# Patient Record
Sex: Female | Born: 1965 | Race: White | Hispanic: No | Marital: Married | Smoking: Current every day smoker
Health system: Southern US, Community
[De-identification: ages and names within clinical notes are randomized; demographics above are authoritative.]

## PROBLEM LIST (undated history)

## (undated) DIAGNOSIS — N200 Calculus of kidney: Secondary | ICD-10-CM

## (undated) DIAGNOSIS — J449 Chronic obstructive pulmonary disease, unspecified: Secondary | ICD-10-CM

## (undated) DIAGNOSIS — E119 Type 2 diabetes mellitus without complications: Secondary | ICD-10-CM

## (undated) HISTORY — PX: COMBINED AUGMENTATION MAMMAPLASTY AND ABDOMINOPLASTY: SUR291

## (undated) HISTORY — PX: BILATERAL CARPAL TUNNEL RELEASE: SHX6508

## (undated) HISTORY — PX: BRAIN SURGERY: SHX531

## (undated) HISTORY — PX: BREAST SURGERY: SHX581

## (undated) HISTORY — PX: ABLATION: SHX5711

## (undated) HISTORY — DX: Calculus of kidney: N20.0

---

## 2021-05-16 ENCOUNTER — Other Ambulatory Visit: Payer: Self-pay

## 2021-05-16 ENCOUNTER — Encounter (HOSPITAL_COMMUNITY): Payer: Self-pay

## 2021-05-16 ENCOUNTER — Emergency Department (HOSPITAL_COMMUNITY)
Admission: EM | Admit: 2021-05-16 | Discharge: 2021-05-16 | Disposition: A | Discharge status: Home / Self Care | Payer: Medicaid Other | Attending: Emergency Medicine | Admitting: Emergency Medicine

## 2021-05-16 ENCOUNTER — Emergency Department (HOSPITAL_COMMUNITY): Payer: Medicaid Other

## 2021-05-16 DIAGNOSIS — Z20822 Contact with and (suspected) exposure to covid-19: Secondary | ICD-10-CM | POA: Diagnosis not present

## 2021-05-16 DIAGNOSIS — J029 Acute pharyngitis, unspecified: Secondary | ICD-10-CM | POA: Diagnosis present

## 2021-05-16 DIAGNOSIS — J069 Acute upper respiratory infection, unspecified: Secondary | ICD-10-CM | POA: Insufficient documentation

## 2021-05-16 DIAGNOSIS — H9209 Otalgia, unspecified ear: Secondary | ICD-10-CM | POA: Insufficient documentation

## 2021-05-16 DIAGNOSIS — J441 Chronic obstructive pulmonary disease with (acute) exacerbation: Secondary | ICD-10-CM | POA: Diagnosis not present

## 2021-05-16 HISTORY — DX: Type 2 diabetes mellitus without complications: E11.9

## 2021-05-16 HISTORY — DX: Chronic obstructive pulmonary disease, unspecified: J44.9

## 2021-05-16 LAB — RESP PANEL BY RT-PCR (RSV, FLU A&B, COVID)  RVPGX2
Influenza A by PCR: NEGATIVE
Influenza B by PCR: NEGATIVE
Resp Syncytial Virus by PCR: NEGATIVE
SARS Coronavirus 2 by RT PCR: NEGATIVE

## 2021-05-16 LAB — GROUP A STREP BY PCR: Group A Strep by PCR: NOT DETECTED

## 2021-05-16 MED ORDER — PREDNISONE 20 MG PO TABS
40.0000 mg | ORAL_TABLET | Freq: Once | ORAL | Status: AC
Start: 2021-05-16 — End: 2021-05-16
  Administered 2021-05-16: 40 mg via ORAL
  Filled 2021-05-16: qty 2

## 2021-05-16 MED ORDER — BENZONATATE 100 MG PO CAPS: 100.0000 mg | ORAL_CAPSULE | Freq: Three times a day (TID) | ORAL | 0 refills | Status: DC

## 2021-05-16 MED ORDER — DOXYCYCLINE HYCLATE 100 MG PO TABS
100.0000 mg | ORAL_TABLET | Freq: Once | ORAL | Status: AC
  Administered 2021-05-16: 100 mg via ORAL
  Filled 2021-05-16: qty 1

## 2021-05-16 MED ORDER — PREDNISONE 20 MG PO TABS: 40.0000 mg | ORAL_TABLET | Freq: Every day | ORAL | 0 refills | Status: DC

## 2021-05-16 MED ORDER — DOXYCYCLINE HYCLATE 100 MG PO CAPS: 100.0000 mg | ORAL_CAPSULE | Freq: Two times a day (BID) | ORAL | 0 refills | Status: AC

## 2021-05-16 NOTE — ED Triage Notes (Signed)
Patient presents to ED with sore throat and right ear pain. Pain started last Friday and symptoms continue to get worse. Patient stated that family members present with similar symptoms. ?

## 2021-05-16 NOTE — ED Provider Notes (Signed)
?[facility] ?Provider Note ? ? ?CSN: [id number] ?Arrival date & time: 05/16/21  1624 ? ?  ? ?History ? ?Chief Complaint  ?Patient presents with  ? Otalgia  ? Sore Throat  ? ? ?Mikayla Williams is a 56 y.o. female. ? ? ?Otalgia ?Associated symptoms: cough and sore throat   ?Associated symptoms: no fever   ?Sore Throat ?Associated symptoms include shortness of breath.  ? 56 y/o female - with COPD - and long time smoker - dx with COPD -on an inhaler regularly and uses oxygen at night at baseline.  Her daughter came home sick from school with coughing sore throat, this is gradually improved and she is states that she is gradually getting better.  However the patient and her spouse have become symptomatic over the last several days and are both coughing with sore throat.  The patient has not had any vomiting or diarrhea.  She does have some right-sided ear pain as well as a sore throat and a cough. ? ?Home Medications ?Prior to Admission medications   ?Medication Sig Start Date End Date Taking? Authorizing Provider  ?benzonatate (TESSALON) 100 MG capsule Take 1 capsule (100 mg total) by mouth every 8 (eight) hours. 05/16/21  Yes Noemi Chapel, MD  ?doxycycline (VIBRAMYCIN) 100 MG capsule Take 1 capsule (100 mg total) by mouth 2 (two) times daily for 7 days. 05/16/21 05/23/21 Yes Noemi Chapel, MD  ?predniSONE (DELTASONE) 20 MG tablet Take 2 tablets (40 mg total) by mouth daily. 05/16/21  Yes Noemi Chapel, MD  ?   ? ?Allergies    ?Codeine   ? ?Review of Systems   ?Review of Systems  ?Constitutional:  Negative for fever.  ?HENT:  Positive for ear pain and sore throat.   ?Respiratory:  Positive for cough and shortness of breath.   ?All other systems reviewed and are negative. ? ?Physical Exam ?Updated Vital Signs ?BP 121/84   Pulse 90   Temp 97.9 ?F (36.6 ?C) (Oral)   Resp 20   Ht 1.6 m ('5\' 3"'$ )   Wt 89.8 kg   SpO2 92%   BMI 35.07 kg/m?  ?Physical Exam ?Vitals and nursing note reviewed.  ?Constitutional:    ?   General: She is not in acute distress. ?   Appearance: She is well-developed.  ?HENT:  ?   Head: Normocephalic and atraumatic.  ?   Right Ear: Tympanic membrane, ear canal and external ear normal.  ?   Left Ear: Tympanic membrane, ear canal and external ear normal.  ?   Nose: Nose normal. No congestion or rhinorrhea.  ?   Mouth/Throat:  ?   Mouth: Mucous membranes are moist.  ?   Pharynx: No oropharyngeal exudate or posterior oropharyngeal erythema.  ?Eyes:  ?   General: No scleral icterus.    ?   Right eye: No discharge.     ?   Left eye: No discharge.  ?   Conjunctiva/sclera: Conjunctivae normal.  ?   Pupils: Pupils are equal, round, and reactive to light.  ?Neck:  ?   Thyroid: No thyromegaly.  ?   Vascular: No JVD.  ?Cardiovascular:  ?   Rate and Rhythm: Normal rate and regular rhythm.  ?   Heart sounds: Normal heart sounds. No murmur heard. ?  No friction rub. No gallop.  ?Pulmonary:  ?   Effort: Pulmonary effort is normal. No respiratory distress.  ?   Breath sounds: No wheezing or rales.  ?   Comments:  Slight diminished breath sounds bilaterally but able to speak in full sentences and is not tachypneic.  Oxygen is 94% on room air ?Abdominal:  ?   General: Bowel sounds are normal. There is no distension.  ?   Palpations: Abdomen is soft. There is no mass.  ?   Tenderness: There is no abdominal tenderness.  ?Musculoskeletal:     ?   General: No tenderness. Normal range of motion.  ?   Cervical back: Normal range of motion and neck supple.  ?   Right lower leg: No edema.  ?   Left lower leg: No edema.  ?Lymphadenopathy:  ?   Cervical: No cervical adenopathy.  ?Skin: ?   General: Skin is warm and dry.  ?   Findings: No erythema or rash.  ?Neurological:  ?   Mental Status: She is alert.  ?   Coordination: Coordination normal.  ?Psychiatric:     ?   Behavior: Behavior normal.  ? ? ?ED Results / Procedures / Treatments   ?Labs ?(all labs ordered are listed, but only abnormal results are displayed) ?Labs Reviewed   ?RESP PANEL BY RT-PCR (RSV, FLU A&B, COVID)  [id number]  ?GROUP A STREP BY PCR  ? ? ?EKG ?None ? ?Radiology ?DG Chest Port 1 View ? ?Result Date: 05/16/2021 ?CLINICAL DATA:  Cough and shortness of breath.  Chest congestion. EXAM: PORTABLE CHEST 1 VIEW COMPARISON:  None. FINDINGS: The cardiomediastinal contours are normal. Subsegmental opacities at the left lung base. Pulmonary vasculature is normal. No pleural effusion or pneumothorax. No acute osseous abnormalities are seen. IMPRESSION: Subsegmental opacities at the left lung base, favor atelectasis, although the possibility of pneumonia in the setting of cough is not entirely excluded. Electronically Signed   By: Keith Rake M.D.   On: 05/16/2021 17:06   ? ?Procedures ?Procedures  ? ? ?Medications Ordered in ED ?Medications  ?doxycycline (VIBRA-TABS) tablet 100 mg (has no administration in time range)  ?predniSONE (DELTASONE) tablet 40 mg (40 mg Oral Given 05/16/21 1713)  ? ? ?ED Course/ Medical Decision Making/ A&P ?  ?                        ?Medical Decision Making ?Amount and/or Complexity of Data Reviewed ?Radiology: ordered. ? ?Risk ?Prescription drug management. ? ? ?This patient presents to the ED for concern of shortness of breath with sore throat differential diagnosis includes viral illness versus pneumonia, given that the patient has COPD she will need a chest x-ray, will also give prednisone for her treatment of some reactive airway disease likely associated with this.  There is no signs of otitis media ? ? ? ?Additional history obtained: ? ?Additional history obtained from patient's spouse, there is no prior medical records ?External records from outside source obtained and reviewed including no prior medical records ? ? ?Lab Tests: ? ?I Ordered, and personally interpreted labs.  The pertinent results include: Viral testing: Negative viral testing, negative strep testing ? ? ?Imaging Studies ordered: ? ?I ordered imaging studies including portable  chest x-ray ?I independently visualized and interpreted imaging which showed possible early pneumonia ?I agree with the radiologist interpretation ? ? ?Medicines ordered and prescription drug management: ? ?I ordered medication including doxycycline, home with Tessalon prednisone and albuterol for ongoing COPD exacerbation ?Reevaluation of the patient after these medicines showed that the patient improved ?I have reviewed the patients home medicines and have made adjustments as needed ? ? ?Problem List /  ED Course: ? ?Possible early pneumonia, treated with doxycycline, vital signs very stable without tachycardia or fever, oxygen is in the mid 90% range and very stable for discharge, already has albuterol inhaler, understands indications for return ? ? ?Social Determinants of Health: ? ?Tobacco use chronically, I did counsel the patient on her tobacco use and states that she does not want to stop ? ? ? ? ? ? ? ? ? ? ?Final Clinical Impression(s) / ED Diagnoses ?Final diagnoses:  ?Viral URI  ?COPD exacerbation ([place])  ? ? ?Rx / DC Orders ?ED Discharge Orders   ? ?      Ordered  ?  predniSONE (DELTASONE) 20 MG tablet  Daily       ? 05/16/21 1819  ?  doxycycline (VIBRAMYCIN) 100 MG capsule  2 times daily       ? 05/16/21 1819  ?  benzonatate (TESSALON) 100 MG capsule  Every 8 hours       ? 05/16/21 1819  ? ?  ?  ? ?  ? ? ?  ?Noemi Chapel, MD ?05/16/21 1821 ? ?

## 2021-05-16 NOTE — Discharge Instructions (Signed)
Your testing did not show any signs of specific diagnosis, you do not have strep COVID or the flu.  You do have some other virus which will gradually get better however the x-ray showed that you may have a very early spot of pneumonia.  For this I would like for you to take the following medication ? ?Doxycycline '100mg'$  by mouth twice daily until the medicine is completely finished - this medicine is a strong antibiotic that treats certain infections.   ? ? ?Please take Tessalon '100mg'$  by mouth 3 times daily for cough as needed. ? ? ?Prednisone is a steroid that helps to reduce certain types of inflammation and may be used for allergic reactions, some rashes such as poison ivy or dermatitis, for asthma attacks or bronchitis and for certain types of pain.  Please take this medicine exactly as prescribed - '40mg'$  by mouth daily for 5 days.  This can have certain side effects with some people including feeling like you can't sleep, feeling anxious or feeling like you are on a "high".  It should not cause weight gain if only taken for a short time.   ? ? ?Thank you for letting Mikayla Williams take care of you today! ? ?Please obtain all of your results from medical records or have your doctors office obtain the results - share them with your doctor - you should be seen at your doctors office in the next 2 days. Call today to arrange your follow up. Take the medications as prescribed. Please review all of the medicines and only take them if you do not have an allergy to them. Please be aware that if you are taking birth control pills, taking other prescriptions, ESPECIALLY ANTIBIOTICS may make the birth control ineffective - if this is the case, either do not engage in sexual activity or use alternative methods of birth control such as condoms until you have finished the medicine and your family doctor says it is OK to restart them. If you are on a blood thinner such as COUMADIN, be aware that any other medicine that you take may cause  the coumadin to either work too much, or not enough - you should have your coumadin level rechecked in next 7 days if this is the case.  ??  ?It is also a possibility that you have an allergic reaction to any of the medicines that you have been prescribed - Everybody reacts differently to medications and while MOST people have no trouble with most medicines, you may have a reaction such as nausea, vomiting, rash, swelling, shortness of breath. If this is the case, please stop taking the medicine immediately and contact your physician.  ? ?If you were given a medication in the ED such as percocet, vicodin, or morphine, be aware that these medicines are sedating and may change your ability to take care of yourself adequately for several hours after being given this medicines - you should not drive or take care of small children if you were given this medicine in the Emergency Department or if you have been prescribed these types of medicines. ??  ? You should return to the ER IMMEDIATELY if you develop severe or worsening symptoms.  ? ?

## 2021-06-04 ENCOUNTER — Ambulatory Visit: Payer: Medicaid Other | Admitting: Cardiology

## 2021-06-16 ENCOUNTER — Ambulatory Visit (INDEPENDENT_AMBULATORY_CARE_PROVIDER_SITE_OTHER): Payer: Medicaid Other | Admitting: Internal Medicine

## 2021-06-16 VITALS — BP 148/90 | HR 76 | Ht 63.0 in | Wt 201.4 lb

## 2021-06-16 DIAGNOSIS — I739 Peripheral vascular disease, unspecified: Secondary | ICD-10-CM | POA: Diagnosis not present

## 2021-06-16 DIAGNOSIS — R0609 Other forms of dyspnea: Secondary | ICD-10-CM

## 2021-06-16 NOTE — Progress Notes (Signed)
?Cardiology Office Note:   ? ?Date:  06/16/2021  ? ?ID:  Mikayla Williams, DOB [birth year], MRN [id number] ? ?PCP:  Ludwig Clarks, FNP ?  ?[facility] Providers ?Cardiologist:  Janina Mayo, MD    ? ?Referring MD: Ludwig Clarks, FNP  ? ?No chief complaint on file. ?DOE ? ?History of Present Illness:   ? ?Mikayla Williams is a 56 y.o. female with a hx of COPD, DM2 , hypothyroid dx on synthroid, referral from Mikayla Williams for [facility] ? ?She's been short of breath for 10 years. It has progressed over time. If she is more active it can get worse with activity. She 's been smoking since age 54. Tried cessation and it has not helped. She's never had PFTs. Her albuterol helps. She denies chest pain or pressure. She had parasthesias in her feet and SOB. She saw a cardiologist in [place] in [place]. She had a lexiscan but there are no results. She notes she did not get the results.  She has an appointment with the pulmonologist on the 24th.  ? ?Notes leg numbness and pain in the calfs with walking. ? ?She has no cardiac dx history or studies. ? ? ?Past Medical History:  ?Diagnosis Date  ? COPD (chronic obstructive pulmonary disease) ([place])   ? Diabetes mellitus without complication ([place])   ? ? ? ?Current Medications: ?Current Meds  ?Medication Sig  ? albuterol (VENTOLIN HFA) 108 (90 Base) MCG/ACT inhaler Inhale 2 puffs into the lungs every 6 (six) hours as needed.  ? atorvastatin (LIPITOR) 40 MG tablet Take 40 mg by mouth daily.  ? benzonatate (TESSALON) 100 MG capsule Take by mouth 3 (three) times daily as needed for cough.  ? cyclobenzaprine (FLEXERIL) 10 MG tablet Take 10 mg by mouth 3 (three) times daily as needed.  ? famotidine (PEPCID) 40 MG tablet Take 40 mg by mouth daily.  ? fenofibrate (TRICOR) 145 MG tablet Take 145 mg by mouth daily.  ? FLUoxetine (PROZAC) 40 MG capsule Take 40 mg by mouth daily.  ? furosemide (LASIX) 20 MG tablet Take 1 tablet by mouth daily.  ? gabapentin (NEURONTIN) 400 MG capsule Take 400 mg by mouth  3 (three) times daily.  ? glyBURIDE (DIABETA) 5 MG tablet Take by mouth daily.  ? GNP ASPIRIN LOW DOSE 81 MG EC tablet Take 81 mg by mouth daily.  ? hydrOXYzine (ATARAX) 25 MG tablet Take 25 mg by mouth 4 (four) times daily.  ? levothyroxine (SYNTHROID) 137 MCG tablet Take 137 mcg by mouth daily.  ? meloxicam (MOBIC) 15 MG tablet Take 15 mg by mouth daily.  ? Vitamin D, Ergocalciferol, (DRISDOL) 1.25 MG (50000 UNIT) CAPS capsule Take by mouth.  ?  ? ?Allergies:   Codeine  ? ?Social History  ? ?Socioeconomic History  ? Marital status: Married  ?  Spouse name: Not on file  ? Number of children: Not on file  ? Years of education: Not on file  ? Highest education level: Not on file  ?Occupational History  ? Not on file  ?Tobacco Use  ? Smoking status: Not on file  ? Smokeless tobacco: Not on file  ?Substance and Sexual Activity  ? Alcohol use: Not on file  ? Drug use: Not on file  ? Sexual activity: Not on file  ?Other Topics Concern  ? Not on file  ?Social History Narrative  ? Not on file  ? ?Social Determinants of Health  ? ?Financial Resource Strain: Not on file  ?  Food Insecurity: Not on file  ?Transportation Needs: Not on file  ?Physical Activity: Not on file  ?Stress: Not on file  ?Social Connections: Not on file  ?  ? ?Family History: ?The patient's Both parents with CHF.  Mother was in her 25s. Deceased in her 91s. ? ?ROS:   ?Please see the history of present illness.    ? All other systems reviewed and are negative. ? ?EKGs/Labs/Other Studies Reviewed:   ? ?The following studies were reviewed today: ? ? ?EKG:  EKG is  ordered today.  The ekg ordered today demonstrates  ? ?06/16/2021 EKG-NSR ? ?Recent Labs: ?No results found for requested labs within last 8760 hours.  ?Recent Lipid Panel ?No results found for: CHOL, TRIG, HDL, CHOLHDL, VLDL, LDLCALC, LDLDIRECT ? ? ?Risk Assessment/Calculations:   ?  ?    ? ?Physical Exam:   ? ?VS:   ? ?Vitals:  ? 06/16/21 1033  ?BP: (!) 148/90  ?Pulse: 76  ?SpO2: 98%  ? ? ? ?Wt  Readings from Last 3 Encounters:  ?06/16/21 201 lb 6.4 oz (91.4 kg)  ?05/16/21 198 lb (89.8 kg)  ?  ? ?GEN:  Well nourished, well developed in no acute distress ?HEENT: Normal ?NECK: No JVD; No carotid bruits ?LYMPHATICS: No lymphadenopathy ?CARDIAC: RRR, no murmurs, rubs, gallops ?RESPIRATORY:  Clear to auscultation without rales, wheezing or rhonchi  ?ABDOMEN: Soft, non-tender, non-distended ?MUSCULOSKELETAL:  No edema; No deformity  ?SKIN: Warm and dry ?Vasc: 2+ DP pulses, skin warm and well perfused. ?NEUROLOGIC:  Alert and oriented x 3 ?PSYCHIATRIC:  Normal affect  ? ?ASSESSMENT:   ? ?DOE: likely related to COPD, recommend PFTs. Will get stress test results.  This is chronic, unlikely related to acute coronary lesion. Discussed the importance of smoking cessation. ? ?Claudication: noted calf pain and paraesthesias. Will get ABIs ? ?HLD- can get yearly lipid profile with Ldl goal < 100 mg/dL. Continue atorvastatin 40 mg daily ? ?HTN- recommended home blood pressure cuff and serial measurements. Provided that today. If not at goal < 130/80 mmhg, can start antihypertensive.  ? ?PLAN:   ? ?In order of problems listed above: ? ?ABI  BL ?Getting records to access her stress test, will follow up ?Follow up 3 months ?   ?Medication Adjustments/Labs and Tests Ordered: ?Current medicines are reviewed at length with the patient today.  Concerns regarding medicines are outlined above.  ?Orders Placed This Encounter  ?Procedures  ? EKG 12-Lead  ? VAS [place] ABI WITH/WO TBI  ? ?No orders of the defined types were placed in this encounter. ? ? ?Patient Instructions  ?Medication Instructions:  ?Your physician recommends that you continue on your current medications as directed. Please refer to the Current Medication list given to you today. ? ?*If you need a refill on your cardiac medications before your next appointment, please call your pharmacy* ? ?Lab Work: ?NONE ordered at this time of appointment  ? ?If you have labs (blood  work) drawn today and your tests are completely normal, you will receive your results only by: ?MyChart Message (if you have MyChart) OR ?A paper copy in the mail ?If you have any lab test that is abnormal or we need to change your treatment, we will call you to review the results. ? ?Testing/Procedures: ?Your physician has requested that you have an ankle brachial index (ABI). During this test an ultrasound and blood pressure cuff are used to evaluate the arteries that supply the arms and legs with blood. Allow  thirty minutes for this exam. There are no restrictions or special instructions. ? ?Please schedule for 1-2 weeks  ? ?Follow-Up: ?At [facility], you and your health needs are our priority.  As part of our continuing mission to provide you with exceptional heart care, we have created designated Provider Care Teams.  These Care Teams include your primary Cardiologist (physician) and Advanced Practice Providers (APPs -  Physician Assistants and Nurse Practitioners) who all work together to provide you with the care you need, when you need it. ? ?We recommend signing up for the patient portal called "MyChart".  Sign up information is provided on this After Visit Summary.  MyChart is used to connect with patients for Virtual Visits (Telemedicine).  Patients are able to view lab/test results, encounter notes, upcoming appointments, etc.  Non-urgent messages can be sent to your provider as well.   ?To learn more about what you can do with MyChart, go to [email or web address].   ? ?Your next appointment:   ?3 month(s) ? ?The format for your next appointment:   ?In Person ? ?Provider:   ?Janina Mayo, MD   ? ? ?Other Instructions ?Monitor blood pressure at home. If your blood pressure is consistently greater than 140/80 please give our office a call.  ? ?Important Information About Sugar ? ? ? ? ? ?  ? ?Signed, ?Janina Mayo, MD  ?06/16/2021 11:00 AM    ?[place] ?

## 2021-06-16 NOTE — Patient Instructions (Signed)
Medication Instructions:  ?Your physician recommends that you continue on your current medications as directed. Please refer to the Current Medication list given to you today. ? ?*If you need a refill on your cardiac medications before your next appointment, please call your pharmacy* ? ?Lab Work: ?NONE ordered at this time of appointment  ? ?If you have labs (blood work) drawn today and your tests are completely normal, you will receive your results only by: ?MyChart Message (if you have MyChart) OR ?A paper copy in the mail ?If you have any lab test that is abnormal or we need to change your treatment, we will call you to review the results. ? ?Testing/Procedures: ?Your physician has requested that you have an ankle brachial index (ABI). During this test an ultrasound and blood pressure cuff are used to evaluate the arteries that supply the arms and legs with blood. Allow thirty minutes for this exam. There are no restrictions or special instructions. ? ?Please schedule for 1-2 weeks  ? ?Follow-Up: ?At [facility], you and your health needs are our priority.  As part of our continuing mission to provide you with exceptional heart care, we have created designated Provider Care Teams.  These Care Teams include your primary Cardiologist (physician) and Advanced Practice Providers (APPs -  Physician Assistants and Nurse Practitioners) who all work together to provide you with the care you need, when you need it. ? ?We recommend signing up for the patient portal called "MyChart".  Sign up information is provided on this After Visit Summary.  MyChart is used to connect with patients for Virtual Visits (Telemedicine).  Patients are able to view lab/test results, encounter notes, upcoming appointments, etc.  Non-urgent messages can be sent to your provider as well.   ?To learn more about what you can do with MyChart, go to [email or web address].   ? ?Your next appointment:   ?3 month(s) ? ?The format for your next  appointment:   ?In Person ? ?Provider:   ?Janina Mayo, MD   ? ? ?Other Instructions ?Monitor blood pressure at home. If your blood pressure is consistently greater than 140/80 please give our office a call.  ? ?Important Information About Sugar ? ? ? ? ? ? ?

## 2021-06-17 ENCOUNTER — Telehealth: Payer: Self-pay

## 2021-06-17 NOTE — Telephone Encounter (Signed)
Received testing from [facility]- patients Stress Test.  ? ?Dr. Harl Bowie reviewed- per Dr. Harl Bowie these are normal.  ? ?Records placed to be scanned to chart.  ? ?Spoke with patient regarding the following results. Patient made aware and patient verbalized understanding.  ? ?

## 2021-06-23 ENCOUNTER — Institutional Professional Consult (permissible substitution): Payer: Medicaid Other | Admitting: Internal Medicine

## 2021-06-25 ENCOUNTER — Institutional Professional Consult (permissible substitution): Payer: Medicaid Other | Admitting: Internal Medicine

## 2021-06-25 ENCOUNTER — Other Ambulatory Visit: Payer: Self-pay | Admitting: Internal Medicine

## 2021-06-25 DIAGNOSIS — I739 Peripheral vascular disease, unspecified: Secondary | ICD-10-CM

## 2021-07-02 ENCOUNTER — Ambulatory Visit (INDEPENDENT_AMBULATORY_CARE_PROVIDER_SITE_OTHER): Payer: Medicaid Other | Admitting: Internal Medicine

## 2021-07-02 ENCOUNTER — Encounter: Payer: Self-pay | Admitting: Internal Medicine

## 2021-07-02 DIAGNOSIS — F17210 Nicotine dependence, cigarettes, uncomplicated: Secondary | ICD-10-CM | POA: Insufficient documentation

## 2021-07-02 DIAGNOSIS — J449 Chronic obstructive pulmonary disease, unspecified: Secondary | ICD-10-CM | POA: Diagnosis not present

## 2021-07-02 MED ORDER — STIOLTO RESPIMAT 2.5-2.5 MCG/ACT IN AERS: INHALATION_SPRAY | RESPIRATORY_TRACT | 11 refills | Status: AC

## 2021-07-02 NOTE — Patient Instructions (Addendum)
Plan A = Automatic = Always=    stiolto  2 puffs each am  ? ?Plan B = Backup (to supplement plan A, not to replace it) ?Only use your albuterol inhaler as a rescue medication to be used if you can't catch your breath by resting or doing a relaxed purse lip breathing pattern.  ?- The less you use it, the better it will work when you need it. ?- Ok to use the inhaler up to 2 puffs  every 4 hours if you must but call for appointment if use goes up over your usual need ?- Don't leave home without it !!  (think of it like the spare tire for your car)  ? ?The key is to stop smoking completely before smoking completely stops you! ? ?We will check your spirometry and walking saturations today ? ?We will call to set you up for a low dose CT for lung cancer screening in [place]  ? ?Please schedule a follow up visit in 3 months but call sooner if needed  ?   ? ?  ? ? ? ?  ?

## 2021-07-02 NOTE — Assessment & Plan Note (Addendum)
The key is to stop smoking completely before smoking completely stops you! ? ? ?Low-dose CT lung cancer screening is recommended for patients who ?are 69-54 years of age with a 20+ pack-year history of smoking and ?who are currently smoking or quit <=15 years ago. ?No coughing up blood  ?No unintentional weight loss of > 15 pounds in the last 6 months  ?- eligible until quits smoking and 20 y after that or age 53 whichever comes first ?>>> referred for lung cancer screening  ? ?Each maintenance medication was reviewed in detail including emphasizing most importantly the difference between maintenance and prns and under what circumstances the prns are to be triggered using an action plan format where appropriate. ? ?Total time for H and P, chart review, counseling, reviewing smi/hfa device(s) , directly observing portions of ambulatory 02 saturation study/ and generating customized AVS unique to this office visit / same day charting  >  45 min new pt eval ?     ?  ?      ?  ?   ?

## 2021-07-02 NOTE — Assessment & Plan Note (Signed)
Active smoker ?- Labs ordered 07/02/2021  :  allergy profile   alpha one AT phenotype  > not done ?- Spirometry 07/02/2021  FEV1 1.7 (64%)  Ratio 0.64 with atypical f/v only in effort dep portion of f/v loop   ?- 07/02/2021  After extensive coaching inhaler device,  effectiveness =    75% with SMI > stiolto trial and f/u in [place] in 3 m ? ?Pt is Group B in terms of symptom/risk and laba/lama therefore appropriate rx at this point >>>  stiolto and approp saba ? ?Re SABA :  I spent extra time with pt today reviewing appropriate use of albuterol for prn use on exertion with the following points: ?1) saba is for relief of sob that does not improve by walking a slower pace or resting but rather if the pt does not improve after trying this first. ?2) If the pt is convinced, as many are, that saba helps recover from activity faster then it's easy to tell if this is the case by re-challenging : ie stop, take the inhaler, then p 5 minutes try the exact same activity (intensity of workload) that just caused the symptoms and see if they are substantially diminished or not after saba ?3) if there is an activity that reproducibly causes the symptoms, try the saba 15 min before the activity on alternate days  ? ?If in fact the saba really does help, then fine to continue to use it prn but advised may need to look closer at the maintenance regimen being used to achieve better control of airways disease with exertion.   ? ?  ?

## 2021-07-02 NOTE — Progress Notes (Signed)
? ?Mikayla Williams, female    DOB: 1965-03-22    MRN: [id number] ? ? ?Brief patient profile:  ?56 yowf active smoker LPN  [place] 2022 referred to pulmonary clinic 07/02/2021 by [name] for sob onset around age mid 12s  ? ? ? ?History of Present Illness  ?07/02/2021  Pulmonary/ 1st office eval/[name]  ?Chief Complaint  ?Patient presents with  ? Pulmonary Consult  ?  Referred by Westley Chandler, FNP. Pt states having SOB "for years"- worse since had covid 19 1 1/2 years ago. She gets winded walking short distances such as lobby to exam room today. She has occ cough and wheezing. She is using her albuterol inhaler about 5 x per wk.   ?Dyspnea:  50 ft and stops does not check or pretreat ?Cough: not now  ?Sleep: on 2lpm flat  ?SABA use: 5 x week always p ex, does not pre treat ? ?No obvious day to day or daytime variability or assoc excess/ purulent sputum or mucus plugs or hemoptysis or cp or chest tightness, subjective wheeze or overt sinus or hb symptoms.  ? ?Sleeping  without nocturnal  or early am exacerbation  of respiratory  c/o's or need for noct saba. Also denies any obvious fluctuation of symptoms with weather or environmental changes or other aggravating or alleviating factors except as outlined above  ? ?No unusual exposure hx or h/o childhood pna/ asthma or knowledge of premature birth. ? ?Current Allergies, Complete Past Medical History, Past Surgical History, Family History, and Social History were reviewed in [employer] record. ? ?ROS  The following are not active complaints unless bolded ?Hoarseness, sore throat, dysphagia, dental problems, itching, sneezing,  nasal congestion or discharge of excess mucus or purulent secretions, ear ache,   fever, chills, sweats, unintended wt loss or wt gain, classically pleuritic or exertional cp,  orthopnea pnd or arm/hand swelling  or leg swelling, presyncope, palpitations, abdominal pain, anorexia, nausea, vomiting,  diarrhea  or change in bowel habits or change in bladder habits, change in stools or change in urine, dysuria, hematuria,  rash, arthralgias, visual complaints, headache, numbness, weakness or ataxia or problems with walking or coordination,  change in mood or  memory. ?      ?   ? ?Past Medical History:  ?Diagnosis Date  ? COPD (chronic obstructive pulmonary disease) ([place])   ? Diabetes mellitus without complication ([place])   ? ? ?Outpatient Medications Prior to Visit  ?Medication Sig Dispense Refill  ? albuterol (VENTOLIN HFA) 108 (90 Base) MCG/ACT inhaler Inhale 2 puffs into the lungs every 6 (six) hours as needed.    ? atorvastatin (LIPITOR) 40 MG tablet Take 40 mg by mouth daily.    ? benzonatate (TESSALON) 100 MG capsule Take by mouth 3 (three) times daily as needed for cough.    ? BIOTIN PO Take 1 tablet by mouth daily.    ? Boswellia Serrata (BOSWELLIA PO) Take by mouth daily.    ? cyclobenzaprine (FLEXERIL) 10 MG tablet Take 10 mg by mouth 3 (three) times daily as needed.    ? famotidine (PEPCID) 40 MG tablet Take 40 mg by mouth daily.    ? fenofibrate (TRICOR) 145 MG tablet Take 145 mg by mouth daily.    ? FLUoxetine (PROZAC) 40 MG capsule Take 40 mg by mouth daily.    ? furosemide (LASIX) 20 MG tablet Take 1 tablet by mouth daily.    ? gabapentin (  NEURONTIN) 400 MG capsule Take 400 mg by mouth 3 (three) times daily.    ? glyBURIDE (DIABETA) 5 MG tablet Take by mouth daily.    ? GNP ASPIRIN LOW DOSE 81 MG EC tablet Take 81 mg by mouth daily.    ? Homeopathic Products (MUSCLE CRAMP COMPLEX PO) Take 2 tablets by mouth in the morning and at bedtime.    ? hydrOXYzine (ATARAX) 25 MG tablet Take 25 mg by mouth 4 (four) times daily.    ? levothyroxine (SYNTHROID) 137 MCG tablet Take 137 mcg by mouth daily.    ? meloxicam (MOBIC) 15 MG tablet Take 15 mg by mouth daily.    ? naproxen sodium (ALEVE) 220 MG tablet Take 220 mg by mouth 2 (two) times daily as needed.    ? Vitamin D, Ergocalciferol, (DRISDOL) 1.25 MG  (50000 UNIT) CAPS capsule Take by mouth.    ? ?No facility-administered medications prior to visit.  ? ? ? ?Objective:  ?  ? ?BP 128/70 (BP Location: Left Arm, Cuff Size: Normal)   Pulse 83   Temp 98 ?F (36.7 ?C) (Oral)   Ht '5\' 3"'$  (1.6 m)   Wt 196 lb 9.6 oz (89.2 kg)   SpO2 96% Comment: on RA  BMI 34.83 kg/m?  ? ?SpO2: 96 % (on RA) ? ?Obese pleasant wf/ smoker's rattle  ? ?HEENT : nl exam   ? ?NECK :  without JVD/Nodes/TM/ nl carotid upstrokes bilaterally ? ? ?LUNGS: no acc muscle use,  Min barrel  contour chest wall with bilateral  mild insp/exp rhonchi  and  without cough on insp or exp maneuvers and min  Hyperresonant  to  percussion bilaterally   ? ? ?CV:  RRR  no s3 or murmur or increase in P2, and no edema  ? ?ABD:  obese soft and nontender with pos end  insp Hoover's  in the supine position. No bruits or organomegaly appreciated, bowel sounds nl ? ?MS:   Nl gait/  ext warm without deformities, calf tenderness, cyanosis or clubbing ?No obvious joint restrictions  ? ?SKIN: warm and dry without lesions   ? ?NEURO:  alert, approp, nl sensorium with  no motor or cerebellar deficits apparent.  ?    ? ? ?I personally reviewed images and agree with radiology impression as follows:  ?CXR:   portable 05/16/21 ?Subsegmental opacities at the left lung base, favor atelectasis, ?although the possibility of pneumonia in the setting of cough is not ?entirely excluded. ?My review:  no as dz/ minimal SSA ? ? ?   ?Assessment  ? ?COPD GOLD 2/ group B ?Active smoker ?- Labs ordered 07/02/2021  :  allergy profile   alpha one AT phenotype  > not done ?- Spirometry 07/02/2021  FEV1 1.7 (64%)  Ratio 0.64 with atypical f/v only in effort dep portion of f/v loop   ?- 07/02/2021  After extensive coaching inhaler device,  effectiveness =    75% with SMI > stiolto trial and f/u in [place] in 3 m ? ?Pt is Group B in terms of symptom/risk and laba/lama therefore appropriate rx at this point >>>  stiolto and approp saba ? ?Re SABA :  I  spent extra time with pt today reviewing appropriate use of albuterol for prn use on exertion with the following points: ?1) saba is for relief of sob that does not improve by walking a slower pace or resting but rather if the pt does not improve after trying this first. ?2)  If the pt is convinced, as many are, that saba helps recover from activity faster then it's easy to tell if this is the case by re-challenging : ie stop, take the inhaler, then p 5 minutes try the exact same activity (intensity of workload) that just caused the symptoms and see if they are substantially diminished or not after saba ?3) if there is an activity that reproducibly causes the symptoms, try the saba 15 min before the activity on alternate days  ? ?If in fact the saba really does help, then fine to continue to use it prn but advised may need to look closer at the maintenance regimen being used to achieve better control of airways disease with exertion.   ? ?  ? ?Cigarette smoker ?The key is to stop smoking completely before smoking completely stops you! ? ? ?Low-dose CT lung cancer screening is recommended for patients who ?are 26-54 years of age with a 20+ pack-year history of smoking and ?who are currently smoking or quit <=15 years ago. ?No coughing up blood  ?No unintentional weight loss of > 15 pounds in the last 6 months  ?- eligible until quits smoking and 62 y after that or age 39 whichever comes first ?>>> referred for lung cancer screening  ? ?Each maintenance medication was reviewed in detail including emphasizing most importantly the difference between maintenance and prns and under what circumstances the prns are to be triggered using an action plan format where appropriate. ? ?Total time for H and P, chart review, counseling, reviewing smi/hfa device(s) , directly observing portions of ambulatory 02 saturation study/ and generating customized AVS unique to this office visit / same day charting  >  45 min new pt eval ?      ?  ?      ?  ?   ? ? ? ? ?Christinia Gully, MD ?07/02/2021 ?  ?

## 2021-07-05 ENCOUNTER — Ambulatory Visit (HOSPITAL_COMMUNITY)
Admission: RE | Admit: 2021-07-05 | Payer: Medicaid Other | Source: Ambulatory Visit | Attending: Internal Medicine | Admitting: Internal Medicine

## 2021-07-23 ENCOUNTER — Inpatient Hospital Stay (HOSPITAL_COMMUNITY): Admission: RE | Admit: 2021-07-23 | Payer: Medicaid Other | Source: Ambulatory Visit

## 2021-08-18 ENCOUNTER — Other Ambulatory Visit: Payer: Self-pay

## 2021-08-18 DIAGNOSIS — F17210 Nicotine dependence, cigarettes, uncomplicated: Secondary | ICD-10-CM

## 2021-08-18 DIAGNOSIS — Z87891 Personal history of nicotine dependence: Secondary | ICD-10-CM

## 2021-08-18 DIAGNOSIS — Z122 Encounter for screening for malignant neoplasm of respiratory organs: Secondary | ICD-10-CM

## 2021-09-07 ENCOUNTER — Emergency Department (HOSPITAL_COMMUNITY): Payer: Medicaid Other

## 2021-09-07 ENCOUNTER — Other Ambulatory Visit: Payer: Self-pay

## 2021-09-07 ENCOUNTER — Emergency Department (HOSPITAL_COMMUNITY)
Admission: EM | Admit: 2021-09-07 | Discharge: 2021-09-07 | Disposition: A | Discharge status: Home / Self Care | Payer: Medicaid Other | Attending: Emergency Medicine | Admitting: Emergency Medicine

## 2021-09-07 ENCOUNTER — Encounter (HOSPITAL_COMMUNITY): Payer: Self-pay | Admitting: Emergency Medicine

## 2021-09-07 DIAGNOSIS — F17210 Nicotine dependence, cigarettes, uncomplicated: Secondary | ICD-10-CM | POA: Insufficient documentation

## 2021-09-07 DIAGNOSIS — E119 Type 2 diabetes mellitus without complications: Secondary | ICD-10-CM | POA: Diagnosis not present

## 2021-09-07 DIAGNOSIS — J449 Chronic obstructive pulmonary disease, unspecified: Secondary | ICD-10-CM | POA: Insufficient documentation

## 2021-09-07 DIAGNOSIS — M5412 Radiculopathy, cervical region: Secondary | ICD-10-CM

## 2021-09-07 DIAGNOSIS — R079 Chest pain, unspecified: Secondary | ICD-10-CM | POA: Diagnosis not present

## 2021-09-07 DIAGNOSIS — R200 Anesthesia of skin: Secondary | ICD-10-CM | POA: Diagnosis not present

## 2021-09-07 DIAGNOSIS — M542 Cervicalgia: Secondary | ICD-10-CM | POA: Diagnosis not present

## 2021-09-07 LAB — BASIC METABOLIC PANEL
Anion gap: 9 (ref 5–15)
BUN: 21 mg/dL — ABNORMAL HIGH (ref 6–20)
CO2: 25 mmol/L (ref 22–32)
Calcium: 9.2 mg/dL (ref 8.9–10.3)
Chloride: 105 mmol/L (ref 98–111)
Creatinine, Ser: 1.13 mg/dL — ABNORMAL HIGH (ref 0.44–1.00)
GFR, Estimated: 58 mL/min — ABNORMAL LOW (ref >60)
Glucose, Bld: 147 mg/dL — ABNORMAL HIGH (ref 70–99)
Potassium: 3.6 mmol/L (ref 3.5–5.1)
Sodium: 139 mmol/L (ref 135–145)

## 2021-09-07 LAB — TROPONIN I (HIGH SENSITIVITY)
Troponin I (High Sensitivity): <2 ng/L (ref <18)
Troponin I (High Sensitivity): <2 ng/L (ref <18)

## 2021-09-07 LAB — CBC
HCT: 45.5 % (ref 36.0–46.0)
Hemoglobin: 15.1 g/dL — ABNORMAL HIGH (ref 12.0–15.0)
MCH: 31.4 pg (ref 26.0–34.0)
MCHC: 33.2 g/dL (ref 30.0–36.0)
MCV: 94.6 fL (ref 80.0–100.0)
Platelets: 237 10*3/uL (ref 150–400)
RBC: 4.81 MIL/uL (ref 3.87–5.11)
RDW: 13.3 % (ref 11.5–15.5)
WBC: 10.9 10*3/uL — ABNORMAL HIGH (ref 4.0–10.5)
nRBC: 0 % (ref 0.0–0.2)

## 2021-09-07 MED ORDER — ACETAMINOPHEN 500 MG PO TABS
1000.0000 mg | ORAL_TABLET | Freq: Once | ORAL | Status: AC
  Administered 2021-09-07: 1000 mg via ORAL
  Filled 2021-09-07: qty 2

## 2021-09-07 MED ORDER — KETOROLAC TROMETHAMINE 15 MG/ML IJ SOLN
15.0000 mg | Freq: Once | INTRAMUSCULAR | Status: AC
Start: 2021-09-07 — End: 2021-09-07
  Administered 2021-09-07: 15 mg via INTRAVENOUS
  Filled 2021-09-07: qty 1

## 2021-09-07 MED ORDER — IOHEXOL 350 MG/ML SOLN
100.0000 mL | Freq: Once | INTRAVENOUS | Status: AC | PRN
  Administered 2021-09-07: 100 mL via INTRAVENOUS

## 2021-09-07 MED ORDER — PREDNISONE 10 MG PO TABS: 40.0000 mg | ORAL_TABLET | Freq: Every day | ORAL | 0 refills | Status: DC

## 2021-09-07 MED ORDER — DIPHENHYDRAMINE HCL 50 MG/ML IJ SOLN
25.0000 mg | Freq: Once | INTRAMUSCULAR | Status: AC
Start: 2021-09-07 — End: 2021-09-07
  Administered 2021-09-07: 25 mg via INTRAVENOUS
  Filled 2021-09-07: qty 1

## 2021-09-07 MED ORDER — SODIUM CHLORIDE 0.9 % IV BOLUS
1000.0000 mL | Freq: Once | INTRAVENOUS | Status: AC
  Administered 2021-09-07: 1000 mL via INTRAVENOUS

## 2021-09-07 MED ORDER — HYDROCODONE-ACETAMINOPHEN 5-325 MG PO TABS: 1.0000 | ORAL_TABLET | Freq: Four times a day (QID) | ORAL | 0 refills | Status: DC | PRN

## 2021-09-07 MED ORDER — FENTANYL CITRATE PF 50 MCG/ML IJ SOSY
50.0000 ug | PREFILLED_SYRINGE | Freq: Once | INTRAMUSCULAR | Status: AC
  Administered 2021-09-07: 50 ug via INTRAVENOUS
  Filled 2021-09-07: qty 1

## 2021-09-07 NOTE — ED Provider Notes (Signed)
[facility] Emergency Department Provider Note MRN:  [id number]  Arrival date & time: 09/07/21     Chief Complaint   Chest Pain   History of Present Illness   Mikayla Williams is a 56 y.o. year-old female with a history of diabetes, COPD presenting to the ED with chief complaint of chest pain.  6 hours ago patient experienced sudden onset left-sided chest pain described as a squeezing cramp, happening intermittently.  Involving the left thoracic back, also having pain to the left side of the neck, left-sided headache, also having decreased sensation to the left face, arm, leg.  Review of Systems  A thorough review of systems was obtained and all systems are negative except as noted in the HPI and PMH.   Patient's Health History    Past Medical History:  Diagnosis Date   COPD (chronic obstructive pulmonary disease) ([place])    Diabetes mellitus without complication ([facility])     Past Surgical History:  Procedure Laterality Date   ABLATION     BILATERAL CARPAL TUNNEL RELEASE     BRAIN SURGERY     BREAST SURGERY     COMBINED AUGMENTATION MAMMAPLASTY AND ABDOMINOPLASTY      Family History  Problem Relation Age of Onset   Emphysema Mother        smoked   Emphysema Maternal Grandmother        smoked    Social History   Socioeconomic History   Marital status: Married    Spouse name: Not on file   Number of children: Not on file   Years of education: Not on file   Highest education level: Not on file  Occupational History   Not on file  Tobacco Use   Smoking status: Every Day    Packs/day: 1.00    Years: 39.00    Total pack years: 39.00    Types: Cigarettes   Smokeless tobacco: Never  Vaping Use   Vaping Use: Never used  Substance and Sexual Activity   Alcohol use: Not Currently   Drug use: Not Currently   Sexual activity: Not on file  Other Topics Concern   Not on file  Social History Narrative   Not on file   Social Determinants of Health    Financial Resource Strain: Not on file  Food Insecurity: Not on file  Transportation Needs: Not on file  Physical Activity: Not on file  Stress: Not on file  Social Connections: Not on file  Intimate Partner Violence: Not on file     Physical Exam   Vitals:   09/07/21 0430 09/07/21 0500  BP: 124/75 125/80  Pulse: 80 71  Resp: 13 15  Temp:    SpO2: 97% 94%    CONSTITUTIONAL: Well-appearing, NAD NEURO/PSYCH:  Alert and oriented x 3, normal and symmetric strength, subjective decreased sensation to left face, leg, arm EYES:  eyes equal and reactive ENT/NECK:  no LAD, no JVD CARDIO: Regular rate, well-perfused, normal S1 and S2 PULM:  CTAB no wheezing or rhonchi GI/GU:  non-distended, non-tender MSK/SPINE:  No gross deformities, no edema SKIN:  no rash, atraumatic   *Additional and/or pertinent findings included in MDM below  Diagnostic and Interventional Summary    EKG Interpretation  Date/Time:  Tuesday September 07 2021 01:39:07 EDT Ventricular Rate:  76 PR Interval:  138 QRS Duration: 92 QT Interval:  326 QTC Calculation: 366 R Axis:   77 Text Interpretation: Normal sinus rhythm Nonspecific T wave abnormality Abnormal ECG No  previous ECGs available Confirmed by Gerlene Fee [phone]) on 09/07/2021 2:20:01 AM       Labs Reviewed  BASIC METABOLIC PANEL - Abnormal; Notable for the following components:      Result Value   Glucose, Bld 147 (*)    BUN 21 (*)    Creatinine, Ser 1.13 (*)    GFR, Estimated 58 (*)    All other components within normal limits  CBC - Abnormal; Notable for the following components:   WBC 10.9 (*)    Hemoglobin 15.1 (*)    All other components within normal limits  TROPONIN I (HIGH SENSITIVITY)  TROPONIN I (HIGH SENSITIVITY)    CT HEAD WO CONTRAST (5MM)  Final Result    CT Angio Chest/Abd/Pel for Dissection W and/or Wo Contrast  Final Result    DG Chest Port 1 View  Final Result    MR BRAIN WO CONTRAST    (Results Pending)  MR  Cervical Spine Wo Contrast    (Results Pending)    Medications  sodium chloride 0.9 % bolus 1,000 mL (0 mLs Intravenous Stopped 09/07/21 0447)  ketorolac (TORADOL) 15 MG/ML injection 15 mg (15 mg Intravenous Given 09/07/21 0305)  diphenhydrAMINE (BENADRYL) injection 25 mg (25 mg Intravenous Given 09/07/21 0304)  acetaminophen (TYLENOL) tablet 1,000 mg (1,000 mg Oral Given 09/07/21 0304)  fentaNYL (SUBLIMAZE) injection 50 mcg (50 mcg Intravenous Given 09/07/21 0307)  iohexol (OMNIPAQUE) 350 MG/ML injection 100 mL (100 mLs Intravenous Contrast Given 09/07/21 9794)     Procedures  /  Critical Care Procedures  ED Course and Medical Decision Making  Initial Impression and Ddx Chest pain with neurological deficit, aortic dissection is considered.  Stroke also considered.  ACS considered as well.  Awaiting troponin x2, CT imaging, will need MRI.  Past medical/surgical history that increases complexity of ED encounter: None  Interpretation of Diagnostics I personally reviewed the EKG and my interpretation is as follows: Sinus rhythm  Labs overall reassuring with no significant blood count or electrolyte disturbance.  Troponin is negative x2.  CT imaging is without evidence of acute stroke, no evidence of dissection.  Patient Reassessment and Ultimate Disposition/Management     On reassessment after migraine cocktail patient has persistent left sensory deficit, will need MRI imaging to exclude stroke, cervical myelopathy.  Signed out to oncoming provider.  Patient management required discussion with the following services or consulting groups:  None  Complexity of Problems Addressed Acute illness or injury that poses threat of life of bodily function  Additional Data Reviewed and Analyzed Further history obtained from: Prior labs/imaging results  Additional Factors Impacting ED Encounter Risk Consideration of hospitalization  Barth Kirks. Sedonia Small, MD [place] [email or web address]  Final Clinical Impressions(s) / ED Diagnoses     ICD-10-CM   1. Chest pain, unspecified type  R07.9     2. Numbness  R20.0       ED Discharge Orders     None        Discharge Instructions Discussed with and Provided to Patient:   Discharge Instructions   None      Maudie Flakes, MD 09/07/21 [phone]

## 2021-09-07 NOTE — Discharge Instructions (Addendum)
Take the hydrocodone as needed for pain relief.  Take the prednisone as directed.  Make an appointment to follow-up with neurosurgery.  Extensive work-up here today just showed evidence of pinched nerves on the left side of the neck.  And somewhat on the right side as well.

## 2021-09-07 NOTE — ED Triage Notes (Signed)
Pt c/o head pain, upper back/chest and left arm pain.

## 2021-09-07 NOTE — ED Notes (Signed)
SPO2 decreased with good waveform. Entered room and pt was asleep- woke pt and asked her to take deep breaths, SPO2 increased appropriately

## 2021-09-14 ENCOUNTER — Encounter: Payer: Medicaid Other | Admitting: Acute Care

## 2021-09-20 ENCOUNTER — Ambulatory Visit: Payer: Medicaid Other | Admitting: Internal Medicine

## 2021-09-20 NOTE — Progress Notes (Deleted)
Cardiology Office Note:    Date:  09/20/2021   ID:  Mikayla Williams, DOB 1965-09-16, MRN [id number]  PCP:  Ludwig Clarks, FNP   [facility] Providers Cardiologist:  Janina Mayo, MD     Referring MD: Ludwig Clarks, FNP   No chief complaint on file. DOE  History of Present Illness:    Mikayla Williams is a 56 y.o. female with a hx of COPD Gold 2 Group B, DM2 , hypothyroid dx on synthroid, referral from Westley Chandler for [employer] been short of breath for 10 years. It has progressed over time. If she is more active it can get worse with activity. She 's been smoking since age 70. Tried cessation and it has not helped. She's never had PFTs. Her albuterol helps. She denies chest pain or pressure. She had parasthesias in her feet and SOB. She saw a cardiologist in [place] in [place]. She had a lexiscan but there are no results. She notes she did not get the results.  She has an appointment with the pulmonologist on the 24th.   Notes leg numbness and pain in the calfs with walking.  She had a myoview 07/15/2020 that was normal.  Interval Hx 09/20/2021: She went to the ED c/o CP on 7/11. She had full lift sided pain.She reported facial numbness. Brain MRI was normal. No signs of ACS. EKG showed NSR.    Past Medical History:  Diagnosis Date   COPD (chronic obstructive pulmonary disease) ([place])    Diabetes mellitus without complication ([place])      Current Medications: No outpatient medications have been marked as taking for the 09/20/21 encounter (Appointment) with Janina Mayo, MD.     Allergies:   Codeine   Social History   Socioeconomic History   Marital status: Married    Spouse name: Not on file   Number of children: Not on file   Years of education: Not on file   Highest education level: Not on file  Occupational History   Not on file  Tobacco Use   Smoking status: Every Day    Packs/day: 1.00    Years: 39.00    Total pack years: 39.00    Types: Cigarettes    Smokeless tobacco: Never  Vaping Use   Vaping Use: Never used  Substance and Sexual Activity   Alcohol use: Not Currently   Drug use: Not Currently   Sexual activity: Not on file  Other Topics Concern   Not on file  Social History Narrative   Not on file   Social Determinants of Health   Financial Resource Strain: Not on file  Food Insecurity: Not on file  Transportation Needs: Not on file  Physical Activity: Not on file  Stress: Not on file  Social Connections: Not on file     Family History: The patient's Both parents with CHF.  Mother was in her 18s. Deceased in her 70s.  ROS:   Please see the history of present illness.     All other systems reviewed and are negative.  EKGs/Labs/Other Studies Reviewed:    The following studies were reviewed today:   EKG:  EKG is  ordered today.  The ekg ordered today demonstrates   06/16/2021 EKG-NSR  Recent Labs: 09/07/2021: BUN 21; Creatinine, Ser 1.13; Hemoglobin 15.1; Platelets 237; Potassium 3.6; Sodium 139  Recent Lipid Panel No results found for: "CHOL", "TRIG", "HDL", "CHOLHDL", "VLDL", "LDLCALC", "LDLDIRECT"   Risk Assessment/Calculations:  Physical Exam:    VS:    There were no vitals filed for this visit.    Wt Readings from Last 3 Encounters:  09/07/21 187 lb (84.8 kg)  07/02/21 196 lb 9.6 oz (89.2 kg)  06/16/21 201 lb 6.4 oz (91.4 kg)     GEN:  Well nourished, well developed in no acute distress HEENT: Normal NECK: No JVD; No carotid bruits LYMPHATICS: No lymphadenopathy CARDIAC: RRR, no murmurs, rubs, gallops RESPIRATORY:  Clear to auscultation without rales, wheezing or rhonchi  ABDOMEN: Soft, non-tender, non-distended MUSCULOSKELETAL:  No edema; No deformity  SKIN: Warm and dry Vasc: 2+ DP pulses, skin warm and well perfused. NEUROLOGIC:  Alert and oriented x 3 PSYCHIATRIC:  Normal affect   ASSESSMENT:    DOE: likely related to COPD, recommend PFTs. Will get stress test results.   This is chronic, unlikely related to acute coronary lesion. Discussed the importance of smoking cessation.  Claudication: noted calf pain and paraesthesias. Pending ABIs***  HLD- can get yearly lipid profile with Ldl goal < 100 mg/dL. Continue atorvastatin 40 mg daily  HTN- recommended home blood pressure cuff and serial measurements. Provided that today. If not at goal < 130/80 mmhg, can start antihypertensive.   PLAN:    In order of problems listed above:  ABI  BL Getting records to access her stress test, will follow up Follow up 3 months    Medication Adjustments/Labs and Tests Ordered: Current medicines are reviewed at length with the patient today.  Concerns regarding medicines are outlined above.  No orders of the defined types were placed in this encounter.  No orders of the defined types were placed in this encounter.   There are no Patient Instructions on file for this visit.   Signed, Janina Mayo, MD  09/20/2021 10:34 AM    [facility]

## 2021-09-21 ENCOUNTER — Ambulatory Visit (HOSPITAL_COMMUNITY): Payer: Medicaid Other

## 2021-10-05 ENCOUNTER — Encounter: Payer: Self-pay | Admitting: Internal Medicine

## 2021-10-11 ENCOUNTER — Ambulatory Visit: Payer: Medicaid Other | Admitting: Internal Medicine

## 2021-10-11 NOTE — Progress Notes (Deleted)
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 Pt states having SOB "for years"- worse since had covid 19 1 1/2 years ago. She gets winded walking short distances such as lobby to exam room today. She has occ cough and wheezing. She is using her albuterol inhaler about 5 x per wk.   Dyspnea:  50 ft and stops does not check or pretreat Cough: not now  Sleep: on 2lpm flat  SABA use: 5 x week always p ex, does not pre treat Rec Plan A = Automatic = Always=    stiolto  2 puffs each am  Plan B = Backup (to supplement plan A, not to replace it) Only use your albuterol inhaler as a rescue medication The key is to stop smoking completely before smoking completely stops you! We will check your spirometry and walking saturations today We will call to set you up for a low dose CT for lung cancer screening in [place]   Please schedule a follow up visit in 3 months but call sooner if needed    10/11/2021  f/u ov/[place] office/Schylar Wuebker re: *** maint on ***  No chief complaint on file.   Dyspnea:  *** Cough: *** Sleeping: *** SABA use: *** 02: *** Covid status: *** Lung cancer screening: ***   No obvious day to day or daytime variability or assoc excess/ purulent sputum or mucus plugs or hemoptysis or cp or chest tightness, subjective wheeze or overt sinus or hb symptoms.   *** without nocturnal  or early am exacerbation  of respiratory  c/o's or need for noct saba. Also denies any obvious fluctuation of symptoms with weather or environmental changes or other aggravating or alleviating factors except as outlined above   No  unusual exposure hx or h/o childhood pna/ asthma or knowledge of premature birth.  Current Allergies, Complete Past Medical History, Past Surgical History, Family History, and Social History were reviewed in [employer] record.  ROS  The following are not active complaints unless bolded Hoarseness, sore throat, dysphagia, dental problems, itching, sneezing,  nasal congestion or discharge of excess mucus or purulent secretions, ear ache,   fever, chills, sweats, unintended wt loss or wt gain, classically pleuritic or exertional cp,  orthopnea pnd or arm/hand swelling  or leg swelling, presyncope, palpitations, abdominal pain, anorexia, nausea, vomiting, diarrhea  or change in bowel habits or change in bladder habits, change in stools or change in urine, dysuria, hematuria,  rash, arthralgias, visual complaints, headache, numbness, weakness or ataxia or problems with walking or coordination,  change in mood or  memory.        No outpatient medications have been marked as taking for the 10/11/21 encounter (Appointment) with Tanda Rockers, MD.                   Past Medical History:  Diagnosis Date   COPD (chronic obstructive pulmonary disease) ([place])    Diabetes mellitus without complication ([place])        Objective:  Wt Readings from Last 3 Encounters:  09/07/21 187 lb (84.8 kg)  07/02/21 196 lb 9.6 oz (89.2 kg)  06/16/21 201 lb 6.4 oz (91.4 kg)      Vital signs reviewed  10/11/2021  - Note at rest 02 sats  ***% on ***   General appearance:    ***       Min bar ***            Assessment

## 2021-10-27 ENCOUNTER — Ambulatory Visit (INDEPENDENT_AMBULATORY_CARE_PROVIDER_SITE_OTHER): Payer: Medicaid Other | Admitting: Acute Care

## 2021-10-27 ENCOUNTER — Encounter: Payer: Self-pay | Admitting: Acute Care

## 2021-10-27 DIAGNOSIS — F17210 Nicotine dependence, cigarettes, uncomplicated: Secondary | ICD-10-CM

## 2021-10-27 NOTE — Patient Instructions (Signed)
Thank you for participating in the [facility] Scre[facility]. It was our pleasure to meet you today. We will call you with the results of your scan within the next few days. Your scan will be assigned a Lung RADS category score by the physicians reading the scans.  This Lung RADS score determines follow up scanning.  See below for description of categories, and follow up screening recommendations. We will be in touch to schedule your follow up screening annually or based on recommendations of our providers. We will fax a copy of your scan results to your Primary Care Physician, or the physician who referred you to the program, to ensure they have the results. Please call the office if you have any questions or concerns regarding your scanning experience or results.  Our office number is [phone]. Please speak with Denise Phelps, RN. , or  Denise Buckner RN, They are  our Lung Cancer Screening RN.'s If They are unavailable when you call, Please leave a message on the voice mail. We will return your call at our earliest convenience.This voice mail is monitored several times a day.  Remember, if your scan is normal, we will scan you annually as long as you continue to meet the criteria for the program. (Age 55-77, Current smoker or smoker who has quit within the last 15 years). If you are a smoker, remember, quitting is the single most powerful action that you can take to decrease your risk of lung cancer and other pulmonary, breathing related problems. We know quitting is hard, and we are here to help.  Please let us know if there is anything we can do to help you meet your goal of quitting. If you are a former smoker, congratulations. We are proud of you! Remain smoke free! Remember you can refer friends or family members through the number above.  We will screen them to make sure they meet criteria for the program. Thank you for helping us take better care of you by  participating in Lung Screening.  You can receive free nicotine replacement therapy ( patches, gum or mints) by calling 1-800-QUIT NOW. Please call so we can get you on the path to becoming  a non-smoker. I know it is hard, but you can do this!  Lung RADS Categories:  Lung RADS 1: no nodules or definitely non-concerning nodules.  Recommendation is for a repeat annual scan in 12 months.  Lung RADS 2:  nodules that are non-concerning in appearance and behavior with a very low likelihood of becoming an active cancer. Recommendation is for a repeat annual scan in 12 months.  Lung RADS 3: nodules that are probably non-concerning , includes nodules with a low likelihood of becoming an active cancer.  Recommendation is for a 6-month repeat screening scan. Often noted after an upper respiratory illness. We will be in touch to make sure you have no questions, and to schedule your 6-month scan.  Lung RADS 4 A: nodules with concerning findings, recommendation is most often for a follow up scan in 3 months or additional testing based on our provider's assessment of the scan. We will be in touch to make sure you have no questions and to schedule the recommended 3 month follow up scan.  Lung RADS 4 B:  indicates findings that are concerning. We will be in touch with you to schedule additional diagnostic testing based on our provider's  assessment of the scan.  Other options for assistance in smoking cessation (   As covered by your insurance benefits)  Hypnosis for smoking cessation  [employer] [phone]  Acupuncture for smoking cessation  [facility] [phone]   

## 2021-10-27 NOTE — Progress Notes (Signed)
Virtual Visit via Telephone Note  I connected with Mikayla Williams on 01/12/21 at  2:00 PM EST by telephone and verified that I am speaking with the correct person using two identifiers.  Location: Patient: Home  Provider: Working from home   I discussed the limitations, risks, security and privacy concerns of performing an evaluation and management service by telephone and the availability of in person appointments. I also discussed with the patient that there may be a patient responsible charge related to this service. The patient expressed understanding and agreed to proceed.  Shared Decision Making Visit Lung Cancer Screening Program [phone])   Eligibility: Age 56 y.o. Pack Years Smoking History Calculation 58 (# packs/per year x # years smoked) Recent History of coughing up blood  no Unexplained weight loss? no ( >Than 15 pounds within the last 6 months ) Prior History Lung / other cancer no (Diagnosis within the last 5 years already requiring surveillance chest CT Scans). Smoking Status Current Smoker Former Smokers: Years since quit: NA  Quit Date: NA  Visit Components: Discussion included one or more decision making aids. yes Discussion included risk/benefits of screening. yes Discussion included potential follow up diagnostic testing for abnormal scans. yes Discussion included meaning and risk of over diagnosis. yes Discussion included meaning and risk of False Positives. yes Discussion included meaning of total radiation exposure. yes  Counseling Included: Importance of adherence to annual lung cancer LDCT screening. yes Impact of comorbidities on ability to participate in the program. yes Ability and willingness to under diagnostic treatment. yes  Smoking Cessation Counseling: Current Smokers:  Discussed importance of smoking cessation. yes Information about tobacco cessation classes and interventions provided to patient. yes Patient provided with "ticket" for LDCT  Scan. yes Symptomatic Patient. yes  Counseling(Intermediate counseling: > three minutes) 99406 Diagnosis Code: Tobacco Use Z72.0 Asymptomatic Patient  NA  Counseling NA Former Smokers:  Discussed the importance of maintaining cigarette abstinence. yes Diagnosis Code: Personal History of Nicotine Dependence. K93.267 Information about tobacco cessation classes and interventions provided to patient. Yes Patient provided with "ticket" for LDCT Scan. yes Written Order for Lung Cancer Screening with LDCT placed in Epic. Yes (CT Chest Lung Cancer Screening Low Dose W/O CM) [id number] Z12.2-Screening of respiratory organs Z87.891-Personal history of nicotine dependence   I spent 25 minutes of face to face time with her discussing the risks and benefits of lung cancer screening. We viewed a power point together that explained in detail the above noted topics. We took the time to pause the power point at intervals to allow for questions to be asked and answered to ensure understanding. We discussed that she had taken the single most powerful action possible to decrease her risk of developing lung cancer when she quit smoking. I counseled her to remain smoke free, and to contact me if she ever had the desire to smoke again so that I can provide resources and tools to help support the effort to remain smoke free. We discussed the time and location of the scan, and that either  Doroteo Glassman RN or I will call with the results within  24-48 hours of receiving them. She has my card and contact information in the event he needs to speak with me, in addition to a copy of the power point we reviewed as a resource. She verbalized understanding of all of the above and had no further questions upon leaving the office.     I explained to the patient that there has been  a high incidence of coronary artery disease noted on these exams. I explained that this is a non-gated exam therefore degree or severity cannot be  determined. This patient is on statin therapy. I have asked the patient to follow-up with their PCP regarding any incidental finding of coronary artery disease and management with diet or medication as they feel is clinically indicated. The patient verbalized understanding of the above and had no further questions.    I spent 3 minutes counseling on smoking cessation and the health risks of continued tobacco abuse    Kamare Caspers D. Kenton Kingfisher, NP-C [facility] Personal contact information can be found on Amion  10/27/2021, 11:31 AM

## 2021-10-28 ENCOUNTER — Ambulatory Visit (HOSPITAL_COMMUNITY)
Admission: RE | Admit: 2021-10-28 | Discharge: 2021-10-28 | Disposition: A | Discharge status: Home / Self Care | Payer: Medicaid Other | Source: Ambulatory Visit | Attending: Acute Care | Admitting: Acute Care

## 2021-10-28 DIAGNOSIS — F17210 Nicotine dependence, cigarettes, uncomplicated: Secondary | ICD-10-CM | POA: Insufficient documentation

## 2021-10-28 DIAGNOSIS — Z122 Encounter for screening for malignant neoplasm of respiratory organs: Secondary | ICD-10-CM | POA: Diagnosis present

## 2021-10-28 DIAGNOSIS — Z87891 Personal history of nicotine dependence: Secondary | ICD-10-CM | POA: Insufficient documentation

## 2021-11-02 ENCOUNTER — Other Ambulatory Visit: Payer: Self-pay

## 2021-11-02 DIAGNOSIS — F17210 Nicotine dependence, cigarettes, uncomplicated: Secondary | ICD-10-CM

## 2021-11-02 DIAGNOSIS — Z122 Encounter for screening for malignant neoplasm of respiratory organs: Secondary | ICD-10-CM

## 2021-11-02 DIAGNOSIS — Z87891 Personal history of nicotine dependence: Secondary | ICD-10-CM

## 2021-11-10 ENCOUNTER — Ambulatory Visit: Payer: Medicaid Other | Admitting: Internal Medicine

## 2021-11-10 NOTE — Progress Notes (Deleted)
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 Pt states having SOB "for years"- worse since had covid 19 1 1/2 years ago. She gets winded walking short distances such as lobby to exam room today. She has occ cough and wheezing. She is using her albuterol inhaler about 5 x per wk.   Dyspnea:  50 ft and stops does not check or pretreat Cough: not now  Sleep: on 2lpm flat  SABA use: 5 x week always p ex, does not pre treat Rec Plan A = Automatic = Always=    stiolto  2 puffs each am  Plan B = Backup (to supplement plan A, not to replace it) Only use your albuterol inhaler as a rescue medication The key is to stop smoking completely before smoking completely stops you! We will check your spirometry and walking saturations today We will call to set you up for a low dose CT for lung cancer screening in [place]   Please schedule a follow up visit in 3 months but call sooner if needed    11/10/2021  f/u ov/[place] office/Mikayla Williams re: *** maint on ***  No chief complaint on file.   Dyspnea:  *** Cough: *** Sleeping: *** SABA use: *** 02: *** Covid status: *** Lung cancer screening: ***   No obvious day to day or daytime variability or assoc excess/ purulent sputum or mucus plugs or hemoptysis or cp or chest tightness, subjective wheeze or overt sinus or hb symptoms.   *** without nocturnal  or early am exacerbation  of respiratory  c/o's or need for noct saba. Also denies any obvious fluctuation of symptoms with weather or environmental changes or other aggravating or alleviating factors except as outlined above   No  unusual exposure hx or h/o childhood pna/ asthma or knowledge of premature birth.  Current Allergies, Complete Past Medical History, Past Surgical History, Family History, and Social History were reviewed in [employer] record.  ROS  The following are not active complaints unless bolded Hoarseness, sore throat, dysphagia, dental problems, itching, sneezing,  nasal congestion or discharge of excess mucus or purulent secretions, ear ache,   fever, chills, sweats, unintended wt loss or wt gain, classically pleuritic or exertional cp,  orthopnea pnd or arm/hand swelling  or leg swelling, presyncope, palpitations, abdominal pain, anorexia, nausea, vomiting, diarrhea  or change in bowel habits or change in bladder habits, change in stools or change in urine, dysuria, hematuria,  rash, arthralgias, visual complaints, headache, numbness, weakness or ataxia or problems with walking or coordination,  change in mood or  memory.        No outpatient medications have been marked as taking for the 11/10/21 encounter (Appointment) with Tanda Rockers, MD.                   Past Medical History:  Diagnosis Date   COPD (chronic obstructive pulmonary disease) ([place])    Diabetes mellitus without complication ([place])        Objective:  Wt Readings from Last 3 Encounters:  09/07/21 187 lb (84.8 kg)  07/02/21 196 lb 9.6 oz (89.2 kg)  06/16/21 201 lb 6.4 oz (91.4 kg)      Vital signs reviewed  11/10/2021  - Note at rest 02 sats  ***% on ***   General appearance:    ***       Min bar ***            Assessment

## 2022-02-14 ENCOUNTER — Other Ambulatory Visit: Payer: Self-pay

## 2022-02-14 ENCOUNTER — Emergency Department (HOSPITAL_COMMUNITY): Payer: Medicaid Other

## 2022-02-14 ENCOUNTER — Encounter (HOSPITAL_COMMUNITY): Payer: Self-pay

## 2022-02-14 ENCOUNTER — Emergency Department (HOSPITAL_COMMUNITY)
Admission: EM | Admit: 2022-02-14 | Discharge: 2022-02-14 | Disposition: A | Discharge status: Home / Self Care | Payer: Medicaid Other | Attending: Emergency Medicine | Admitting: Emergency Medicine

## 2022-02-14 DIAGNOSIS — R1031 Right lower quadrant pain: Secondary | ICD-10-CM | POA: Diagnosis present

## 2022-02-14 DIAGNOSIS — Z7982 Long term (current) use of aspirin: Secondary | ICD-10-CM | POA: Insufficient documentation

## 2022-02-14 DIAGNOSIS — F17210 Nicotine dependence, cigarettes, uncomplicated: Secondary | ICD-10-CM | POA: Insufficient documentation

## 2022-02-14 DIAGNOSIS — Z7984 Long term (current) use of oral hypoglycemic drugs: Secondary | ICD-10-CM | POA: Insufficient documentation

## 2022-02-14 DIAGNOSIS — D259 Leiomyoma of uterus, unspecified: Secondary | ICD-10-CM | POA: Insufficient documentation

## 2022-02-14 DIAGNOSIS — E119 Type 2 diabetes mellitus without complications: Secondary | ICD-10-CM | POA: Diagnosis not present

## 2022-02-14 DIAGNOSIS — Z1152 Encounter for screening for COVID-19: Secondary | ICD-10-CM | POA: Insufficient documentation

## 2022-02-14 DIAGNOSIS — N3001 Acute cystitis with hematuria: Secondary | ICD-10-CM | POA: Insufficient documentation

## 2022-02-14 DIAGNOSIS — D219 Benign neoplasm of connective and other soft tissue, unspecified: Secondary | ICD-10-CM

## 2022-02-14 DIAGNOSIS — J449 Chronic obstructive pulmonary disease, unspecified: Secondary | ICD-10-CM | POA: Diagnosis not present

## 2022-02-14 DIAGNOSIS — N390 Urinary tract infection, site not specified: Secondary | ICD-10-CM

## 2022-02-14 DIAGNOSIS — R109 Unspecified abdominal pain: Secondary | ICD-10-CM

## 2022-02-14 LAB — CBC WITH DIFFERENTIAL/PLATELET
Abs Immature Granulocytes: 0.08 10*3/uL — ABNORMAL HIGH (ref 0.00–0.07)
Basophils Absolute: 0.1 10*3/uL (ref 0.0–0.1)
Basophils Relative: 1 %
Eosinophils Absolute: 0.2 10*3/uL (ref 0.0–0.5)
Eosinophils Relative: 2 %
HCT: 44 % (ref 36.0–46.0)
Hemoglobin: 14.9 g/dL (ref 12.0–15.0)
Immature Granulocytes: 1 %
Lymphocytes Relative: 28 %
Lymphs Abs: 3 10*3/uL (ref 0.7–4.0)
MCH: 31.4 pg (ref 26.0–34.0)
MCHC: 33.9 g/dL (ref 30.0–36.0)
MCV: 92.8 fL (ref 80.0–100.0)
Monocytes Absolute: 1 10*3/uL (ref 0.1–1.0)
Monocytes Relative: 9 %
Neutro Abs: 6.3 10*3/uL (ref 1.7–7.7)
Neutrophils Relative %: 59 %
Platelets: 215 10*3/uL (ref 150–400)
RBC: 4.74 MIL/uL (ref 3.87–5.11)
RDW: 14.5 % (ref 11.5–15.5)
WBC: 10.7 10*3/uL — ABNORMAL HIGH (ref 4.0–10.5)
nRBC: 0 % (ref 0.0–0.2)

## 2022-02-14 LAB — URINALYSIS, ROUTINE W REFLEX MICROSCOPIC
Bilirubin Urine: NEGATIVE
Glucose, UA: NEGATIVE mg/dL
Ketones, ur: NEGATIVE mg/dL
Nitrite: POSITIVE — AB
Protein, ur: NEGATIVE mg/dL
Specific Gravity, Urine: 1.019 (ref 1.005–1.030)
pH: 5 (ref 5.0–8.0)

## 2022-02-14 LAB — COMPREHENSIVE METABOLIC PANEL
ALT: 22 U/L (ref 0–44)
AST: 32 U/L (ref 15–41)
Albumin: 3.9 g/dL (ref 3.5–5.0)
Alkaline Phosphatase: 98 U/L (ref 38–126)
Anion gap: 8 (ref 5–15)
BUN: 19 mg/dL (ref 6–20)
CO2: 24 mmol/L (ref 22–32)
Calcium: 9 mg/dL (ref 8.9–10.3)
Chloride: 106 mmol/L (ref 98–111)
Creatinine, Ser: 0.84 mg/dL (ref 0.44–1.00)
GFR, Estimated: >60 mL/min (ref >60)
Glucose, Bld: 122 mg/dL — ABNORMAL HIGH (ref 70–99)
Potassium: 4.4 mmol/L (ref 3.5–5.1)
Sodium: 138 mmol/L (ref 135–145)
Total Bilirubin: 1.2 mg/dL (ref 0.3–1.2)
Total Protein: 6.7 g/dL (ref 6.5–8.1)

## 2022-02-14 LAB — RESP PANEL BY RT-PCR (RSV, FLU A&B, COVID)  RVPGX2
Influenza A by PCR: NEGATIVE
Influenza B by PCR: NEGATIVE
Resp Syncytial Virus by PCR: NEGATIVE
SARS Coronavirus 2 by RT PCR: NEGATIVE

## 2022-02-14 LAB — LIPASE, BLOOD: Lipase: 44 U/L (ref 11–51)

## 2022-02-14 MED ORDER — SODIUM CHLORIDE 0.9 % IV SOLN
1.0000 g | Freq: Once | INTRAVENOUS | Status: AC
  Administered 2022-02-14: 1 g via INTRAVENOUS
  Filled 2022-02-14: qty 10

## 2022-02-14 MED ORDER — ONDANSETRON HCL 4 MG PO TABS: 4.0000 mg | ORAL_TABLET | ORAL | 0 refills | Status: DC | PRN

## 2022-02-14 MED ORDER — HYDROMORPHONE HCL 1 MG/ML IJ SOLN
1.0000 mg | Freq: Once | INTRAMUSCULAR | Status: AC
  Administered 2022-02-14: 1 mg via INTRAVENOUS
  Filled 2022-02-14: qty 1

## 2022-02-14 MED ORDER — IOHEXOL 300 MG/ML  SOLN
100.0000 mL | Freq: Once | INTRAMUSCULAR | Status: AC | PRN
Start: 2022-02-14 — End: 2022-02-14
  Administered 2022-02-14: 100 mL via INTRAVENOUS

## 2022-02-14 MED ORDER — ONDANSETRON HCL 4 MG/2ML IJ SOLN
4.0000 mg | Freq: Once | INTRAMUSCULAR | Status: AC
  Administered 2022-02-14: 4 mg via INTRAVENOUS
  Filled 2022-02-14: qty 2

## 2022-02-14 MED ORDER — LACTATED RINGERS IV BOLUS
1000.0000 mL | Freq: Once | INTRAVENOUS | Status: AC
  Administered 2022-02-14: 1000 mL via INTRAVENOUS

## 2022-02-14 MED ORDER — CEPHALEXIN 500 MG PO CAPS: 500.0000 mg | ORAL_CAPSULE | Freq: Three times a day (TID) | ORAL | 0 refills | Status: AC

## 2022-02-14 NOTE — ED Provider Notes (Signed)
[facility] EMERGENCY DEPARTMENT Provider Note   CSN: [id number] Arrival date & time: 02/14/22  4403     History  Chief Complaint  Patient presents with   Abdominal Pain    Mikayla Williams is a 56 y.o. female.  Patient as above with significant medical history as below, including COPD, DM who presents to the ED with complaint of nausea, abd pain Nausea worsening over last 4-5 days, has become constant but no vomiting Reduced po intake 2/2 nausea Pain rlq/ruq abdomen last 2-3 days Pain intermittent initially now constant Aching pain, does not radiate No change to bowel/bladder fxn, no prior abd surg, no abn vaginal bleeding/discharge, no recent travel or sick contacts, no meds pta for symptoms      Past Medical History:  Diagnosis Date   COPD (chronic obstructive pulmonary disease) ([place])    Diabetes mellitus without complication ([place])     Past Surgical History:  Procedure Laterality Date   ABLATION     BILATERAL CARPAL TUNNEL RELEASE     BRAIN SURGERY     BREAST SURGERY     COMBINED AUGMENTATION MAMMAPLASTY AND ABDOMINOPLASTY       The history is provided by the patient. No language interpreter was used.  Abdominal Pain Associated symptoms: fatigue and nausea   Associated symptoms: no chest pain, no chills, no cough, no fever, no hematuria, no shortness of breath and no vomiting        Home Medications Prior to Admission medications   Medication Sig Start Date End Date Taking? Authorizing Provider  cephALEXin (KEFLEX) 500 MG capsule Take 1 capsule (500 mg total) by mouth 3 (three) times daily for 7 days. 02/14/22 02/21/22 Yes Wynona Dove A, DO  ondansetron (ZOFRAN) 4 MG tablet Take 1 tablet (4 mg total) by mouth every 4 (four) hours as needed for nausea or vomiting. 02/14/22  Yes Wynona Dove A, DO  albuterol (VENTOLIN HFA) 108 (90 Base) MCG/ACT inhaler Inhale 2 puffs into the lungs every 6 (six) hours as needed.    [provider]  atorvastatin (LIPITOR)  40 MG tablet Take 40 mg by mouth daily. 02/24/21   [provider]  benzonatate (TESSALON) 100 MG capsule Take by mouth 3 (three) times daily as needed for cough.    [provider]  BIOTIN PO Take 1 tablet by mouth daily.    [provider]  Azucena Freed Serrata (BOSWELLIA PO) Take by mouth daily.    [provider]  cyclobenzaprine (FLEXERIL) 10 MG tablet Take 10 mg by mouth 3 (three) times daily as needed. 01/23/21   [provider]  famotidine (PEPCID) 40 MG tablet Take 40 mg by mouth daily. 02/24/21   [provider]  fenofibrate (TRICOR) 145 MG tablet Take 145 mg by mouth daily. 02/24/21   [provider]  FLUoxetine (PROZAC) 40 MG capsule Take 40 mg by mouth daily. 05/19/21   [provider]  furosemide (LASIX) 20 MG tablet Take 1 tablet by mouth daily.    [provider]  gabapentin (NEURONTIN) 400 MG capsule Take 400 mg by mouth 3 (three) times daily. 05/19/21   [provider]  glyBURIDE (DIABETA) 5 MG tablet Take by mouth daily. 05/19/21   [provider]  GNP ASPIRIN LOW DOSE 81 MG EC tablet Take 81 mg by mouth daily. 02/24/21   [provider]  Homeopathic Products (MUSCLE CRAMP COMPLEX PO) Take 2 tablets by mouth in the morning and at bedtime.    [provider]  HYDROcodone-acetaminophen (NORCO/VICODIN) 5-325 MG tablet Take 1 tablet by mouth every 6 (six) hours as needed for moderate pain. 09/07/21   Fredia Sorrow, MD  hydrOXYzine (ATARAX) 25 MG tablet Take 25 mg by mouth 4 (four) times daily. 02/24/21   [provider]  levothyroxine (SYNTHROID) 137 MCG tablet Take 137 mcg by mouth daily. 06/02/21   [provider]  meloxicam (MOBIC) 15 MG tablet Take 15 mg by mouth daily. 06/02/21   [provider]  naproxen sodium (ALEVE) 220 MG tablet Take 220 mg by mouth 2 (two) times daily as needed.    [provider]  predniSONE (DELTASONE) 10 MG  tablet Take 4 tablets (40 mg total) by mouth daily. 09/07/21   Fredia Sorrow, MD  Tiotropium Bromide-Olodaterol (STIOLTO RESPIMAT) 2.5-2.5 MCG/ACT AERS 2 puffs 1st thing in am 07/02/21   Tanda Rockers, MD  Vitamin D, Ergocalciferol, (DRISDOL) 1.25 MG (50000 UNIT) CAPS capsule Take by mouth. 05/19/21   [provider]      Allergies    Codeine    Review of Systems   Review of Systems  Constitutional:  Positive for fatigue. Negative for chills and fever.  HENT:  Negative for facial swelling and trouble swallowing.   Eyes:  Negative for photophobia and visual disturbance.  Respiratory:  Negative for cough and shortness of breath.   Cardiovascular:  Negative for chest pain and palpitations.  Gastrointestinal:  Positive for abdominal pain and nausea. Negative for vomiting.  Endocrine: Negative for polydipsia and polyuria.  Genitourinary:  Negative for difficulty urinating and hematuria.  Musculoskeletal:  Negative for gait problem and joint swelling.  Skin:  Negative for pallor and rash.  Neurological:  Negative for syncope and headaches.  Psychiatric/Behavioral:  Negative for agitation and confusion.     Physical Exam Updated Vital Signs BP 112/63   Pulse 82   Temp 97.8 F (36.6 C) (Oral)   Resp 16   Ht '5\' 3"'$  (1.6 m)   Wt 84.4 kg   SpO2 91%   BMI 32.95 kg/m  Physical Exam Vitals and nursing note reviewed.  Constitutional:      General: She is not in acute distress.    Appearance: Normal appearance. She is well-developed. She is not ill-appearing.  HENT:     Head: Normocephalic and atraumatic.     Right Ear: External ear normal.     Left Ear: External ear normal.     Nose: Nose normal.     Mouth/Throat:     Mouth: Mucous membranes are moist.  Eyes:     General: No scleral icterus.       Right eye: No discharge.        Left eye: No discharge.  Cardiovascular:     Rate and Rhythm: Normal rate and regular rhythm.     Pulses: Normal pulses.     Heart sounds:  Normal heart sounds.  Pulmonary:     Effort: Pulmonary effort is normal. No respiratory distress.     Breath sounds: Normal breath sounds.  Abdominal:     General: Abdomen is flat.     Palpations: Abdomen is soft.     Tenderness: There is abdominal tenderness in the right upper quadrant. There is no guarding or rebound.  Musculoskeletal:        General: Normal range of motion.     Cervical back: Normal range of motion.     Right lower leg: No edema.     Left lower leg:  No edema.  Skin:    General: Skin is warm and dry.     Capillary Refill: Capillary refill takes less than 2 seconds.  Neurological:     Mental Status: She is alert.  Psychiatric:        Mood and Affect: Mood normal.        Behavior: Behavior normal.     ED Results / Procedures / Treatments   Labs (all labs ordered are listed, but only abnormal results are displayed) Labs Reviewed  CBC WITH DIFFERENTIAL/PLATELET - Abnormal; Notable for the following components:      Result Value   WBC 10.7 (*)    Abs Immature Granulocytes 0.08 (*)    All other components within normal limits  COMPREHENSIVE METABOLIC PANEL - Abnormal; Notable for the following components:   Glucose, Bld 122 (*)    All other components within normal limits  URINALYSIS, ROUTINE W REFLEX MICROSCOPIC - Abnormal; Notable for the following components:   APPearance HAZY (*)    Hgb urine dipstick SMALL (*)    Nitrite POSITIVE (*)    Leukocytes,Ua TRACE (*)    Bacteria, UA FEW (*)    All other components within normal limits  RESP PANEL BY RT-PCR (RSV, FLU A&B, COVID)  [id number]  URINE CULTURE  LIPASE, BLOOD    EKG None  Radiology CT ABDOMEN PELVIS W CONTRAST  Result Date: 02/14/2022 CLINICAL DATA:  RIGHT LOWER QUADRANT abdominal pain. Nausea and vomiting. EXAM: CT ABDOMEN AND PELVIS WITH CONTRAST TECHNIQUE: Multidetector CT imaging of the abdomen and pelvis was performed using the standard protocol following bolus administration of intravenous  contrast. RADIATION DOSE REDUCTION: This exam was performed according to the departmental dose-optimization program which includes automated exposure control, adjustment of the mA and/or kV according to patient size and/or use of iterative reconstruction technique. CONTRAST:  118m OMNIPAQUE IOHEXOL 300 MG/ML  SOLN COMPARISON:  CT of the chest on 10/28/2021 FINDINGS: Lower chest: No acute abnormality. Hepatobiliary: No focal liver abnormality is seen. No radiopaque gallstones, biliary dilatation, or pericholecystic inflammatory changes. Pancreas: Unremarkable. No pancreatic ductal dilatation or surrounding inflammatory changes. Spleen: Normal in size without focal abnormality. Adrenals/Urinary Tract: Adrenal glands are normal. Kidneys are symmetric in size and enhancement/excretion. No renal mass. Ureters are unremarkable. The bladder and visualized portion of the urethra are normal. Stomach/Bowel: The stomach and small bowel loops are normal in appearance. The appendix is well seen and normal in appearance. There are numerous colonic diverticula without acute diverticulitis. Moderate stool burden. Vascular/Lymphatic: There is atherosclerotic calcification of the abdominal aorta not associated with aneurysm. No retroperitoneal or mesenteric adenopathy. Reproductive: The uterus is present. Within the LEFT adnexal region, there is a solid mass measuring 3.5 x 3.4 centimeters (image 67 of series 2). Mass appears to be interposed between the LEFT ovary and the uterus, possibly representing broad ligament fibroid or exophytic fibroid. RIGHT adnexal region is unremarkable. There is no free pelvic fluid. Other: No abdominal wall hernia or abnormality. No abdominopelvic ascites. Musculoskeletal: No acute or significant osseous findings. IMPRESSION: 1. No acute abnormality of the abdomen or pelvis. 2. Normal appendix. 3. Colonic diverticulosis without acute diverticulitis. 4. Solid mass in the LEFT adnexal region measuring  3.5 x 3.4 centimeters. Mass appears to be interposed between the LEFT ovary and the uterus, possibly representing broad ligament fibroid or exophytic fibroid. Recommend further evaluation with pelvic ultrasound. 5. Moderate stool burden. 6.  Aortic Atherosclerosis (ICD10-I70.0). Electronically Signed   By: ENolon NationsM.D.  On: 02/14/2022 10:02    Procedures Procedures    Medications Ordered in ED Medications  lactated ringers bolus 1,000 mL (0 mLs Intravenous Stopped 02/14/22 0850)  ondansetron (ZOFRAN) injection 4 mg (4 mg Intravenous Given 02/14/22 0757)  HYDROmorphone (DILAUDID) injection 1 mg (1 mg Intravenous Given 02/14/22 0758)  cefTRIAXone (ROCEPHIN) 1 g in sodium chloride 0.9 % 100 mL IVPB (0 g Intravenous Stopped 02/14/22 0921)  iohexol (OMNIPAQUE) 300 MG/ML solution 100 mL (100 mLs Intravenous Contrast Given 02/14/22 0907)    ED Course/ Medical Decision Making/ A&P                           Medical Decision Making Amount and/or Complexity of Data Reviewed Labs: ordered. Radiology: ordered.  Risk Prescription drug management.   This patient presents to the ED with chief complaint(s) of abd pain nausea with pertinent past medical history of as above which further complicates the presenting complaint. The complaint involves an extensive differential diagnosis and also carries with it a high risk of complications and morbidity.     Differential diagnosis includes but is not exclusive to acute cholecystitis, intrathoracic causes for epigastric abdominal pain, gastritis, duodenitis, pancreatitis, small bowel or large bowel obstruction, abdominal aortic aneurysm, hernia, gastritis, etc. Differential diagnosis includes but is not exclusive to ectopic pregnancy, ovarian cyst, ovarian torsion, acute appendicitis, urinary tract infection, endometriosis, bowel obstruction, hernia, colitis, renal colic, gastroenteritis, volvulus etc.   . Serious etiologies were considered.    The initial plan is to screening labs imaging symptom control   Additional history obtained: Additional history obtained from  na Records reviewed [place] and prior ed visits, prior labs/imaging/home meds   Independent labs interpretation:  The following labs were independently interpreted:  Ua concerning for UTI, send culture, give rocephin, does not appear to be septic or pyelonephritis Slight leukocytosis on CBC CMP stable  Independent visualization of imaging: - I independently visualized the following imaging with scope of interpretation limited to determining acute life threatening conditions related to emergency care: CTAP, which revealed left adnexal mass, diverticulosis  Cardiac monitoring was reviewed and interpreted by myself which shows NSR  Treatment and Reassessment: Zofran Dilaudid Ivf > improved   Consultation: - Consulted or discussed management/test interpretation w/ external professional: na  Consideration for admission or further workup: Admission was considered   Pt here with nausea, abd pain. Found to have UTI, low suspicion for pyelo given lack of flank pain and no vomiting, fever. Tolerating PO well. Not septic. Adnexal mass on CT ?pos fibroid, advised her to f/u with gyn for o/p u/s to further characterize this  The patient improved significantly and was discharged in stable condition. Detailed discussions were had with the patient regarding current findings, and need for close f/u with PCP or on call doctor. The patient has been instructed to return immediately if the symptoms worsen in any way for re-evaluation. Patient verbalized understanding and is in agreement with current care plan. All questions answered prior to discharge.    Social Determinants of health: Counseled patient for approximately 3 minutes regarding smoking cessation. Discussed risks of smoking and how they applied and affected their visit here today. Patient not  ready to quit at this time, however will follow up with their primary doctor when they are.   CPT code: [phone]: intermediate counseling for smoking cessation   Social History   Tobacco Use   Smoking status: Every Day    Packs/day:  1.50    Years: 39.00    Total pack years: 58.50    Types: Cigarettes   Smokeless tobacco: Never  Vaping Use   Vaping Use: Never used  Substance Use Topics   Alcohol use: Not Currently   Drug use: Not Currently            Final Clinical Impression(s) / ED Diagnoses Final diagnoses:  Abdominal pain, unspecified abdominal location  Fibroid  Urinary tract infection with hematuria, site unspecified    Rx / DC Orders ED Discharge Orders          Ordered    cephALEXin (KEFLEX) 500 MG capsule  3 times daily        02/14/22 1018    ondansetron (ZOFRAN) 4 MG tablet  Every 4 hours PRN        02/14/22 1018              Wynona Dove A, DO 02/14/22 1020

## 2022-02-14 NOTE — ED Notes (Signed)
Patient verbalizes agreement to [facility] via Dr. Pearline Cables

## 2022-02-14 NOTE — ED Triage Notes (Signed)
Patient states right lower abdominal pain with constant nausea for 4 days that states has gotten worse.

## 2022-02-14 NOTE — Discharge Instructions (Addendum)
Please follow up with your OBGYN for an outpatient pelvic ultrasound to further characterize fibroid  It was a pleasure caring for you today in the emergency department.  Please return to the emergency department for any worsening or worrisome symptoms.

## 2022-02-16 LAB — URINE CULTURE: Culture: >=100000 — AB

## 2022-02-17 ENCOUNTER — Telehealth (HOSPITAL_BASED_OUTPATIENT_CLINIC_OR_DEPARTMENT_OTHER): Payer: Self-pay

## 2022-02-17 NOTE — Telephone Encounter (Signed)
Post ED Visit - Positive Culture Follow-up  Culture report reviewed by antimicrobial stewardship pharmacist: [place] Team '[x]'$  Luisa Hart, [place].D, BCPS '[]'$  Heide Guile, Pharm.D., BCPS AQ-ID '[]'$  Parks Neptune, Pharm.D., BCPS '[]'$  Alycia Rossetti, Pharm.D., BCPS '[]'$  Allisonia, Pharm.D., BCPS, AAHIVP '[]'$  Legrand Como, Pharm.D., BCPS, AAHIVP '[]'$  Salome Arnt, PharmD, BCPS '[]'$  Johnnette Gourd, PharmD, BCPS '[]'$  Hughes Better, PharmD, BCPS '[]'$  Leeroy Cha, PharmD '[]'$  Laqueta Linden, PharmD, BCPS '[]'$  Albertina Parr, PharmD  [place] Team '[]'$  Leodis Sias, PharmD '[]'$  Lindell Spar, PharmD '[]'$  Royetta Asal, PharmD '[]'$  Graylin Shiver, Rph '[]'$  Rema Fendt) Glennon Mac, PharmD '[]'$  Arlyn Dunning, PharmD '[]'$  Netta Cedars, PharmD '[]'$  Dia Sitter, PharmD '[]'$  Leone Haven, PharmD '[]'$  Gretta Arab, PharmD '[]'$  Theodis Shove, PharmD '[]'$  Peggyann Juba, PharmD '[]'$  Reuel Boom, PharmD   Positive urine culture Treated with Cephalexin, organism sensitive to the same and no further patient follow-up is required at this time.  Glennon Hamilton 02/17/2022, 11:40 AM

## 2022-03-17 ENCOUNTER — Other Ambulatory Visit (HOSPITAL_COMMUNITY)
Admission: RE | Admit: 2022-03-17 | Discharge: 2022-03-17 | Disposition: A | Discharge status: Home / Self Care | Payer: Medicaid Other | Source: Ambulatory Visit | Attending: Obstetrics & Gynecology | Admitting: Obstetrics & Gynecology

## 2022-03-17 ENCOUNTER — Ambulatory Visit (INDEPENDENT_AMBULATORY_CARE_PROVIDER_SITE_OTHER): Payer: Medicaid Other | Admitting: Obstetrics & Gynecology

## 2022-03-17 ENCOUNTER — Other Ambulatory Visit: Payer: Self-pay | Admitting: *Deleted

## 2022-03-17 ENCOUNTER — Encounter: Payer: Self-pay | Admitting: Obstetrics & Gynecology

## 2022-03-17 VITALS — BP 134/85 | HR 77 | Ht 63.0 in | Wt 188.0 lb

## 2022-03-17 DIAGNOSIS — N951 Menopausal and female climacteric states: Secondary | ICD-10-CM

## 2022-03-17 DIAGNOSIS — N390 Urinary tract infection, site not specified: Secondary | ICD-10-CM | POA: Diagnosis not present

## 2022-03-17 DIAGNOSIS — Z124 Encounter for screening for malignant neoplasm of cervix: Secondary | ICD-10-CM | POA: Diagnosis present

## 2022-03-17 DIAGNOSIS — D219 Benign neoplasm of connective and other soft tissue, unspecified: Secondary | ICD-10-CM

## 2022-03-17 LAB — POCT URINALYSIS DIPSTICK
Blood, UA: NEGATIVE
Glucose, UA: NEGATIVE
Ketones, UA: NEGATIVE
Leukocytes, UA: NEGATIVE
Nitrite, UA: NEGATIVE
Protein, UA: NEGATIVE

## 2022-03-17 MED ORDER — VEOZAH 45 MG PO TABS: 1.0000 | ORAL_TABLET | Freq: Every day | ORAL | 11 refills | Status: AC

## 2022-03-17 NOTE — Addendum Note (Signed)
Addended by: Janece Canterbury on: 03/17/2022 12:14 PM   Modules accepted: Orders

## 2022-03-17 NOTE — Progress Notes (Signed)
Follow up appointment for results: fibroid  Chief Complaint  Patient presents with   Follow-up    Seen in ER.     Blood pressure 134/85, pulse 77, height '5\' 3"'$  (1.6 m), weight 188 lb (85.3 kg).   General WDWN female NAD Vulva:  normal appearing vulva with no masses, tenderness or lesions Vagina:  normal mucosa, no discharge Cervix:  Normal no lesions Uterus:  normal size, contour, position, consistency, mobility, non-tender Adnexa: ovaries:present,  normal adnexa in size, nontender and no masses  CT ABDOMEN PELVIS W CONTRAST  Result Date: 02/14/2022 CLINICAL DATA:  RIGHT LOWER QUADRANT abdominal pain. Nausea and vomiting. EXAM: CT ABDOMEN AND PELVIS WITH CONTRAST TECHNIQUE: Multidetector CT imaging of the abdomen and pelvis was performed using the standard protocol following bolus administration of intravenous contrast. RADIATION DOSE REDUCTION: This exam was performed according to the departmental dose-optimization program which includes automated exposure control, adjustment of the mA and/or kV according to patient size and/or use of iterative reconstruction technique. CONTRAST:  172m OMNIPAQUE IOHEXOL 300 MG/ML  SOLN COMPARISON:  CT of the chest on 10/28/2021 FINDINGS: Lower chest: No acute abnormality. Hepatobiliary: No focal liver abnormality is seen. No radiopaque gallstones, biliary dilatation, or pericholecystic inflammatory changes. Pancreas: Unremarkable. No pancreatic ductal dilatation or surrounding inflammatory changes. Spleen: Normal in size without focal abnormality. Adrenals/Urinary Tract: Adrenal glands are normal. Kidneys are symmetric in size and enhancement/excretion. No renal mass. Ureters are unremarkable. The bladder and visualized portion of the urethra are normal. Stomach/Bowel: The stomach and small bowel loops are normal in appearance. The appendix is well seen and normal in appearance. There are numerous colonic diverticula without acute diverticulitis. Moderate  stool burden. Vascular/Lymphatic: There is atherosclerotic calcification of the abdominal aorta not associated with aneurysm. No retroperitoneal or mesenteric adenopathy. Reproductive: The uterus is present. Within the LEFT adnexal region, there is a solid mass measuring 3.5 x 3.4 centimeters (image 67 of series 2). Mass appears to be interposed between the LEFT ovary and the uterus, possibly representing broad ligament fibroid or exophytic fibroid. RIGHT adnexal region is unremarkable. There is no free pelvic fluid. Other: No abdominal wall hernia or abnormality. No abdominopelvic ascites. Musculoskeletal: No acute or significant osseous findings. IMPRESSION: 1. No acute abnormality of the abdomen or pelvis. 2. Normal appendix. 3. Colonic diverticulosis without acute diverticulitis. 4. Solid mass in the LEFT adnexal region measuring 3.5 x 3.4 centimeters. Mass appears to be interposed between the LEFT ovary and the uterus, possibly representing broad ligament fibroid or exophytic fibroid. Recommend further evaluation with pelvic ultrasound. 5. Moderate stool burden. 6.  Aortic Atherosclerosis (ICD10-I70.0). Electronically Signed   By: ENolon NationsM.D.   On: 02/14/2022 10:02     Recent Results (from the past 2160 hour(s))  Resp panel by RT-PCR (RSV, Flu A&B, Covid) Anterior Nasal Swab     Status: None   Collection Time: 02/14/22  7:40 AM   Specimen: Anterior Nasal Swab  Result Value Ref Range   SARS Coronavirus 2 by RT PCR NEGATIVE NEGATIVE    Comment: (NOTE) SARS-CoV-2 target nucleic acids are NOT DETECTED.  The SARS-CoV-2 RNA is generally detectable in upper respiratory specimens during the acute phase of infection. The lowest concentration of SARS-CoV-2 viral copies this assay can detect is 138 copies/mL. A negative result does not preclude SARS-Cov-2 infection and should not be used as the sole basis for treatment or other patient management decisions. A negative result may occur with   improper specimen collection/handling, submission of specimen  other than nasopharyngeal swab, presence of viral mutation(s) within the areas targeted by this assay, and inadequate number of viral copies(<138 copies/mL). A negative result must be combined with clinical observations, patient history, and epidemiological information. The expected result is Negative.  Fact Sheet for Patients:  EntrepreneurPulse.com.au  Fact Sheet for Healthcare Providers:  IncredibleEmployment.be  This test is no t yet approved or cleared by the [place] FDA and  has been authorized for detection and/or diagnosis of SARS-CoV-2 by FDA under an Emergency Use Authorization (EUA). This EUA will remain  in effect (meaning this test can be used) for the duration of the COVID-19 declaration under Section 564(b)(1) of the Act, 21 U.S.C.section 360bbb-3(b)(1), unless the authorization is terminated  or revoked sooner.       Influenza A by PCR NEGATIVE NEGATIVE   Influenza B by PCR NEGATIVE NEGATIVE    Comment: (NOTE) The Xpert Xpress SARS-CoV-2/FLU/RSV plus assay is intended as an aid in the diagnosis of influenza from Nasopharyngeal swab specimens and should not be used as a sole basis for treatment. Nasal washings and aspirates are unacceptable for Xpert Xpress SARS-CoV-2/FLU/RSV testing.  Fact Sheet for Patients: EntrepreneurPulse.com.au  Fact Sheet for Healthcare Providers: IncredibleEmployment.be  This test is not yet approved or cleared by the [place] FDA and has been authorized for detection and/or diagnosis of SARS-CoV-2 by FDA under an Emergency Use Authorization (EUA). This EUA will remain in effect (meaning this test can be used) for the duration of the COVID-19 declaration under Section 564(b)(1) of the Act, 21 U.S.C. section 360bbb-3(b)(1), unless the authorization is terminated or revoked.     Resp  Syncytial Virus by PCR NEGATIVE NEGATIVE    Comment: (NOTE) Fact Sheet for Patients: EntrepreneurPulse.com.au  Fact Sheet for Healthcare Providers: IncredibleEmployment.be  This test is not yet approved or cleared by the [place] FDA and has been authorized for detection and/or diagnosis of SARS-CoV-2 by FDA under an Emergency Use Authorization (EUA). This EUA will remain in effect (meaning this test can be used) for the duration of the COVID-19 declaration under Section 564(b)(1) of the Act, 21 U.S.C. section 360bbb-3(b)(1), unless the authorization is terminated or revoked.  Performed at [facility], [address], [place], [place] [address]   CBC with Differential     Status: Abnormal   Collection Time: 02/14/22  7:50 AM  Result Value Ref Range   WBC 10.7 (H) 4.0 - 10.5 K/uL   RBC 4.74 3.87 - 5.11 MIL/uL   Hemoglobin 14.9 12.0 - 15.0 g/dL   HCT 44.0 36.0 - 46.0 %   MCV 92.8 80.0 - 100.0 fL   MCH 31.4 26.0 - 34.0 pg   MCHC 33.9 30.0 - 36.0 g/dL   RDW 14.5 11.5 - 15.5 %   Platelets 215 150 - 400 K/uL   nRBC 0.0 0.0 - 0.2 %   Neutrophils Relative % 59 %   Neutro Abs 6.3 1.7 - 7.7 K/uL   Lymphocytes Relative 28 %   Lymphs Abs 3.0 0.7 - 4.0 K/uL   Monocytes Relative 9 %   Monocytes Absolute 1.0 0.1 - 1.0 K/uL   Eosinophils Relative 2 %   Eosinophils Absolute 0.2 0.0 - 0.5 K/uL   Basophils Relative 1 %   Basophils Absolute 0.1 0.0 - 0.1 K/uL   Immature Granulocytes 1 %   Abs Immature Granulocytes 0.08 (H) 0.00 - 0.07 K/uL    Comment: Performed at [facility], [address] [place], [place] [address]  Comprehensive  metabolic panel     Status: Abnormal   Collection Time: 02/14/22  7:50 AM  Result Value Ref Range   Sodium 138 135 - 145 mmol/L   Potassium 4.4 3.5 - 5.1 mmol/L   Chloride 106 98 - 111 mmol/L   CO2 24 22 - 32 mmol/L   Glucose, Bld 122 (H) 70 - 99 mg/dL    Comment: Glucose reference range applies only to samples  taken after fasting for at least 8 hours.   BUN 19 6 - 20 mg/dL   Creatinine, Ser 0.84 0.44 - 1.00 mg/dL   Calcium 9.0 8.9 - 10.3 mg/dL   Total Protein 6.7 6.5 - 8.1 g/dL   Albumin 3.9 3.5 - 5.0 g/dL   AST 32 15 - 41 U/L   ALT 22 0 - 44 U/L   Alkaline Phosphatase 98 38 - 126 U/L   Total Bilirubin 1.2 0.3 - 1.2 mg/dL   GFR, Estimated >60 >60 mL/min    Comment: (NOTE) Calculated using the CKD-EPI Creatinine Equation (2021)    Anion gap 8 5 - 15    Comment: Performed at [facility], [address], [place], [place] [address]  Lipase, blood     Status: None   Collection Time: 02/14/22  7:50 AM  Result Value Ref Range   Lipase 44 11 - 51 U/L    Comment: Performed at [facility], [address] [place], [place] [address]  Urinalysis, Routine w reflex microscopic Urine, Clean Catch     Status: Abnormal   Collection Time: 02/14/22  7:50 AM  Result Value Ref Range   Color, Urine YELLOW YELLOW   APPearance HAZY (A) CLEAR   Specific Gravity, Urine 1.019 1.005 - 1.030   pH 5.0 5.0 - 8.0   Glucose, UA NEGATIVE NEGATIVE mg/dL   Hgb urine dipstick SMALL (A) NEGATIVE   Bilirubin Urine NEGATIVE NEGATIVE   Ketones, ur NEGATIVE NEGATIVE mg/dL   Protein, ur NEGATIVE NEGATIVE mg/dL   Nitrite POSITIVE (A) NEGATIVE   Leukocytes,Ua TRACE (A) NEGATIVE   RBC / HPF 0-5 0 - 5 RBC/hpf   WBC, UA 6-10 0 - 5 WBC/hpf   Bacteria, UA FEW (A) NONE SEEN   Squamous Epithelial / HPF 6-10 0 - 5   Mucus PRESENT     Comment: Performed at [facility], [address], [place], [place] [address]  Urine Culture     Status: Abnormal   Collection Time: 02/14/22  8:00 AM   Specimen: Urine, Clean Catch  Result Value Ref Range   Specimen Description      URINE, CLEAN CATCH Performed at [facility], [address], [place], [place] [address]    Special Requests      NONE Performed at [facility], [address], [place], [place] [address]    Culture >=100,000 COLONIES/mL ESCHERICHIA COLI (A)    Report Status  02/16/2022 FINAL    Organism ID, Bacteria ESCHERICHIA COLI (A)       Susceptibility   Escherichia coli - MIC*    AMPICILLIN <=2 SENSITIVE Sensitive     CEFAZOLIN <=4 SENSITIVE Sensitive     CEFEPIME <=0.12 SENSITIVE Sensitive     CEFTRIAXONE <=0.25 SENSITIVE Sensitive     CIPROFLOXACIN <=0.25 SENSITIVE Sensitive     GENTAMICIN <=1 SENSITIVE Sensitive     IMIPENEM <=0.25 SENSITIVE Sensitive     NITROFURANTOIN <=16 SENSITIVE Sensitive     TRIMETH/SULFA <=20 SENSITIVE Sensitive     AMPICILLIN/SULBACTAM <=2 SENSITIVE Sensitive  PIP/TAZO <=4 SENSITIVE Sensitive     * >=100,000 COLONIES/mL ESCHERICHIA COLI     MEDS ordered this encounter: Meds ordered this encounter  Medications   Fezolinetant (VEOZAH) 45 MG TABS    Sig: Take 1 tablet by mouth at bedtime.    Dispense:  30 tablet    Refill:  11    Orders for this encounter: No orders of the defined types were placed in this encounter.   Impression + Management Plan   ICD-10-CM   1. Fibroid  D21.9    CT images reviewed, 3.4 cm left broad ligmanet fibroid of no clinical consequence, not source of patient's discomfort that caused ED visit, no bleeding    2. Vasomotor symptoms due to menopause  N95.1    trial veozah 45 mg daily    3. Routine cervical smear  Z12.4 Cytology - PAP( [place])      Follow Up: No follow-ups on file.     All questions were answered.  Past Medical History:  Diagnosis Date   COPD (chronic obstructive pulmonary disease) ([place])    Diabetes mellitus without complication ([place])     Past Surgical History:  Procedure Laterality Date   ABLATION     BILATERAL CARPAL TUNNEL RELEASE     BRAIN SURGERY     BREAST SURGERY     COMBINED AUGMENTATION MAMMAPLASTY AND ABDOMINOPLASTY      OB History     Gravida  4   Para  2   Term      Preterm      AB  2   Living  2      SAB      IAB      Ectopic      Multiple      Live Births              Allergies  Allergen Reactions    Codeine Itching    Social History   Socioeconomic History   Marital status: Married    Spouse name: Not on file   Number of children: Not on file   Years of education: Not on file   Highest education level: Not on file  Occupational History   Not on file  Tobacco Use   Smoking status: Every Day    Packs/day: 1.50    Years: 39.00    Total pack years: 58.50    Types: Cigarettes   Smokeless tobacco: Never  Vaping Use   Vaping Use: Never used  Substance and Sexual Activity   Alcohol use: Not Currently   Drug use: Not Currently   Sexual activity: Yes    Birth control/protection: Surgical, Post-menopausal  Other Topics Concern   Not on file  Social History Narrative   Not on file   Social Determinants of Health   Financial Resource Strain: High Risk (03/17/2022)   Overall Financial Resource Strain (CARDIA)    Difficulty of Paying Living Expenses: Hard  Food Insecurity: Food Insecurity Present (03/17/2022)   Hunger Vital Sign    Worried About Running Out of Food in the Last Year: Sometimes true    Ran Out of Food in the Last Year: Sometimes true  Transportation Needs: No Transportation Needs (03/17/2022)   PRAPARE - Hydrologist (Medical): No    Lack of Transportation (Non-Medical): No  Physical Activity: Inactive (03/17/2022)   Exercise Vital Sign    Days of Exercise per Week: 0 days    Minutes of Exercise  per Session: 0 min  Stress: Stress Concern Present (03/17/2022)   [place]    Feeling of Stress : To some extent  Social Connections: Socially Isolated (03/17/2022)   Social Connection and Isolation Panel [NHANES]    Frequency of Communication with Friends and Family: More than three times a week    Frequency of Social Gatherings with Friends and Family: Once a week    Attends Religious Services: Never    Marine scientist or Organizations: No    Attends Theatre manager Meetings: Never    Marital Status: Separated    Family History  Problem Relation Age of Onset   Emphysema Mother        smoked   Emphysema Maternal Grandmother        smoked

## 2022-03-19 LAB — URINE CULTURE

## 2022-03-21 ENCOUNTER — Encounter: Payer: Self-pay | Admitting: Obstetrics & Gynecology

## 2022-03-22 LAB — CYTOLOGY - PAP
Chlamydia: NEGATIVE
Comment: NEGATIVE
Comment: NEGATIVE
Comment: NEGATIVE
Comment: NEGATIVE
Comment: NORMAL
Diagnosis: UNDETERMINED — AB
HPV 16: NEGATIVE
HPV 18 / 45: NEGATIVE
High risk HPV: POSITIVE — AB
Neisseria Gonorrhea: NEGATIVE

## 2022-04-11 ENCOUNTER — Telehealth: Payer: Self-pay | Admitting: *Deleted

## 2022-04-11 NOTE — Telephone Encounter (Signed)
Called patient to see if she was taking the [facility]. States she was given samples at the office and is taking them but she spoke to her pharmacy and the medication was denied.  States she did get a copay card as well. Informed patient that most likely it was denied since she is not over 43yrold but could take the card to the pharmacy when she is out of samples.  Pt verbalized understanding with no further questions.

## 2022-04-19 ENCOUNTER — Encounter: Payer: Self-pay | Admitting: Obstetrics & Gynecology

## 2022-04-22 ENCOUNTER — Ambulatory Visit: Payer: Medicaid Other | Admitting: Obstetrics & Gynecology

## 2022-05-02 ENCOUNTER — Emergency Department (HOSPITAL_COMMUNITY): Payer: Medicaid Other

## 2022-05-02 ENCOUNTER — Emergency Department (HOSPITAL_COMMUNITY)
Admission: EM | Admit: 2022-05-02 | Discharge: 2022-05-02 | Disposition: A | Discharge status: Home / Self Care | Payer: Medicaid Other | Attending: Emergency Medicine | Admitting: Emergency Medicine

## 2022-05-02 DIAGNOSIS — J441 Chronic obstructive pulmonary disease with (acute) exacerbation: Secondary | ICD-10-CM | POA: Diagnosis not present

## 2022-05-02 DIAGNOSIS — Z7984 Long term (current) use of oral hypoglycemic drugs: Secondary | ICD-10-CM | POA: Diagnosis not present

## 2022-05-02 DIAGNOSIS — E119 Type 2 diabetes mellitus without complications: Secondary | ICD-10-CM | POA: Insufficient documentation

## 2022-05-02 DIAGNOSIS — R0602 Shortness of breath: Secondary | ICD-10-CM | POA: Diagnosis present

## 2022-05-02 DIAGNOSIS — F17200 Nicotine dependence, unspecified, uncomplicated: Secondary | ICD-10-CM | POA: Insufficient documentation

## 2022-05-02 DIAGNOSIS — Z1152 Encounter for screening for COVID-19: Secondary | ICD-10-CM | POA: Insufficient documentation

## 2022-05-02 LAB — CBC
HCT: 44.7 % (ref 36.0–46.0)
Hemoglobin: 14.8 g/dL (ref 12.0–15.0)
MCH: 31.4 pg (ref 26.0–34.0)
MCHC: 33.1 g/dL (ref 30.0–36.0)
MCV: 94.7 fL (ref 80.0–100.0)
Platelets: 205 10*3/uL (ref 150–400)
RBC: 4.72 MIL/uL (ref 3.87–5.11)
RDW: 13.9 % (ref 11.5–15.5)
WBC: 9.4 10*3/uL (ref 4.0–10.5)
nRBC: 0 % (ref 0.0–0.2)

## 2022-05-02 LAB — RESP PANEL BY RT-PCR (RSV, FLU A&B, COVID)  RVPGX2
Influenza A by PCR: NEGATIVE
Influenza B by PCR: NEGATIVE
Resp Syncytial Virus by PCR: NEGATIVE
SARS Coronavirus 2 by RT PCR: NEGATIVE

## 2022-05-02 LAB — BASIC METABOLIC PANEL
Anion gap: 8 (ref 5–15)
BUN: 20 mg/dL (ref 6–20)
CO2: 25 mmol/L (ref 22–32)
Calcium: 9.2 mg/dL (ref 8.9–10.3)
Chloride: 104 mmol/L (ref 98–111)
Creatinine, Ser: 0.82 mg/dL (ref 0.44–1.00)
GFR, Estimated: >60 mL/min (ref >60)
Glucose, Bld: 119 mg/dL — ABNORMAL HIGH (ref 70–99)
Potassium: 4.3 mmol/L (ref 3.5–5.1)
Sodium: 137 mmol/L (ref 135–145)

## 2022-05-02 LAB — BLOOD GAS, VENOUS
Acid-Base Excess: 2.7 mmol/L — ABNORMAL HIGH (ref 0.0–2.0)
Bicarbonate: 28.9 mmol/L — ABNORMAL HIGH (ref 20.0–28.0)
Drawn by: 12
O2 Saturation: 58.9 %
Patient temperature: 36.5
pCO2, Ven: 49 mmHg (ref 44–60)
pH, Ven: 7.38 (ref 7.25–7.43)
pO2, Ven: <31 mmHg — CL (ref 32–45)

## 2022-05-02 MED ORDER — METHYLPREDNISOLONE 4 MG PO TBPK: ORAL_TABLET | ORAL | 0 refills | Status: DC

## 2022-05-02 MED ORDER — AZITHROMYCIN 250 MG PO TABS: ORAL_TABLET | ORAL | 0 refills | Status: AC

## 2022-05-02 MED ORDER — IPRATROPIUM-ALBUTEROL 0.5-2.5 (3) MG/3ML IN SOLN
3.0000 mL | Freq: Once | RESPIRATORY_TRACT | Status: AC
  Administered 2022-05-02: 3 mL via RESPIRATORY_TRACT
  Filled 2022-05-02: qty 3

## 2022-05-02 MED ORDER — METHYLPREDNISOLONE SODIUM SUCC 125 MG IJ SOLR
125.0000 mg | Freq: Once | INTRAMUSCULAR | Status: AC
  Administered 2022-05-02: 125 mg via INTRAVENOUS
  Filled 2022-05-02: qty 2

## 2022-05-02 NOTE — Discharge Instructions (Addendum)
Return to the ED with any new or worsening signs or symptoms Please follow-up with pulmonology Please read attached guide concerning COPD I have placed you on a steroid taper.  Please begin taking this tomorrow.  I have also placed you on a Z-Pak.  You will take 500 mg today and then 250 mg for the next 4 days starting tomorrow. Please discontinue smoking

## 2022-05-02 NOTE — ED Notes (Signed)
Reports "feel better after neb", resting comfortably.

## 2022-05-02 NOTE — ED Provider Notes (Signed)
[facility] Provider Note   CSN: [id number] Arrival date & time: 05/02/22  [id number]     History  Chief Complaint  Patient presents with   Shortness of Breath    Mikayla Williams is a 57 y.o. female with medical history diabetes, COPD, patient still smokes.  Patient presents to ED for evaluation of shortness of breath and wheezing.  Patient reports that for the last 2 days she has been short of breath and having productive cough with wheezing.  Patient also endorsing generalized bodyaches and chills, sore throat.  Patient states that she has been attempting use rescue inhalers at home with no relief of symptoms.  Patient denies fevers however does endorse body aches and chills, states that she has been around people with COVID.  Patient denies abdominal pain, nausea or vomiting.  Patient reports that she wears oxygen as needed at night to sleep, denies increasing this recently.   Shortness of Breath Associated symptoms: wheezing   Associated symptoms: no abdominal pain, no chest pain, no fever and no vomiting        Home Medications Prior to Admission medications   Medication Sig Start Date End Date Taking? Authorizing Provider  azithromycin (ZITHROMAX Z-PAK) 250 MG tablet Take 2 tablets (500 mg total) by mouth daily for 1 day, THEN 1 tablet (250 mg total) daily for 4 days. 05/02/22 05/07/22 Yes Azucena Cecil, PA-C  methylPREDNISolone (MEDROL DOSEPAK) 4 MG TBPK tablet Day 1 take 2 tablets before breakfast, 1 tablet after lunch, 1 tablet after supper and 2 tablets at bedtime.  Day 2 take 1 tablet before breakfast, 1 tablet after lunch and 1 tablet after supper and 2 tablets at bedtime.  Take 3 take 1 tablet before breakfast, 1 tablet after lunch, 1 tablet after supper and 1 tablet at bedtime.  Day 4 take 1 tablet before breakfast, 1 tablet after lunch, 1 tablet at bedtime.  Day 5 take 1 tablet before breakfast and 1 tablet at bedtime.  Day 6 take 1  tablet before breakfast. 05/02/22  Yes Azucena Cecil, PA-C  albuterol (VENTOLIN HFA) 108 (90 Base) MCG/ACT inhaler Inhale 2 puffs into the lungs every 6 (six) hours as needed.    [provider]  atorvastatin (LIPITOR) 40 MG tablet Take 40 mg by mouth daily. 02/24/21   [provider]  benzonatate (TESSALON) 100 MG capsule Take by mouth 3 (three) times daily as needed for cough.    [provider]  famotidine (PEPCID) 40 MG tablet Take 40 mg by mouth daily. 02/24/21   [provider]  fenofibrate (TRICOR) 145 MG tablet Take 145 mg by mouth daily. 02/24/21   [provider]  Fezolinetant (VEOZAH) 45 MG TABS Take 1 tablet by mouth at bedtime. 03/17/22   Florian Buff, MD  FLUoxetine (PROZAC) 40 MG capsule Take 40 mg by mouth daily. 05/19/21   [provider]  furosemide (LASIX) 20 MG tablet Take 1 tablet by mouth daily.    [provider]  gabapentin (NEURONTIN) 400 MG capsule Take 400 mg by mouth 3 (three) times daily. 05/19/21   [provider]  glyBURIDE (DIABETA) 5 MG tablet Take by mouth daily. 05/19/21   [provider]  hydrOXYzine (ATARAX) 25 MG tablet Take 25 mg by mouth 4 (four) times daily. 02/24/21   [provider]  levothyroxine (SYNTHROID) 137 MCG tablet Take 137 mcg by mouth daily. 06/02/21   [provider]  meloxicam (  MOBIC) 15 MG tablet Take 15 mg by mouth daily. 06/02/21   [provider]  Tiotropium Bromide-Olodaterol (STIOLTO RESPIMAT) 2.5-2.5 MCG/ACT AERS 2 puffs 1st thing in am 07/02/21   Tanda Rockers, MD      Allergies    Codeine    Review of Systems   Review of Systems  Constitutional:  Positive for chills. Negative for fever.  Respiratory:  Positive for shortness of breath and wheezing.   Cardiovascular:  Negative for chest pain.  Gastrointestinal:  Negative for abdominal pain, nausea and vomiting.  Musculoskeletal:  Positive for myalgias.  All other systems  reviewed and are negative.   Physical Exam Updated Vital Signs BP 139/88   Pulse 92   Temp 97.7 F (36.5 C) (Oral)   Resp (!) 24   SpO2 100%  Physical Exam Vitals and nursing note reviewed.  Constitutional:      General: She is not in acute distress.    Appearance: She is not ill-appearing, toxic-appearing or diaphoretic.  HENT:     Head: Normocephalic and atraumatic.     Nose: Nose normal.     Mouth/Throat:     Mouth: Mucous membranes are moist.     Pharynx: Oropharynx is clear.  Eyes:     Conjunctiva/sclera: Conjunctivae normal.     Pupils: Pupils are equal, round, and reactive to light.  Cardiovascular:     Rate and Rhythm: Normal rate and regular rhythm.  Pulmonary:     Effort: Pulmonary effort is normal.     Breath sounds: Wheezing present.     Comments: Decreased breath sounds Abdominal:     General: Abdomen is flat.     Tenderness: There is no abdominal tenderness.  Musculoskeletal:     Cervical back: Normal range of motion and neck supple. No tenderness.  Skin:    General: Skin is warm and dry.     Capillary Refill: Capillary refill takes less than 2 seconds.  Neurological:     Mental Status: She is alert and oriented to person, place, and time.     ED Results / Procedures / Treatments   Labs (all labs ordered are listed, but only abnormal results are displayed) Labs Reviewed  BASIC METABOLIC PANEL - Abnormal; Notable for the following components:      Result Value   Glucose, Bld 119 (*)    All other components within normal limits  BLOOD GAS, VENOUS - Abnormal; Notable for the following components:   pO2, Ven <31 (*)    Bicarbonate 28.9 (*)    Acid-Base Excess 2.7 (*)    All other components within normal limits  RESP PANEL BY RT-PCR (RSV, FLU A&B, COVID)  [id number]  CBC    EKG None  Radiology DG Chest 2 View  Result Date: 05/02/2022 CLINICAL DATA:  Cough EXAM: CHEST - 2 VIEW COMPARISON:  09/07/2021 FINDINGS: The heart size and mediastinal  contours are within normal limits. Both lungs are clear. The visualized skeletal structures are unremarkable. IMPRESSION: No active cardiopulmonary disease. Electronically Signed   By: Jerilynn Mages.  Shick M.D.   On: 05/02/2022 10:53    Procedures Procedures   Medications Ordered in ED Medications  ipratropium-albuterol (DUONEB) 0.5-2.5 (3) MG/3ML nebulizer solution 3 mL (3 mLs Nebulization Given 05/02/22 1057)  methylPREDNISolone sodium succinate (SOLU-MEDROL) 125 mg/2 mL injection 125 mg (125 mg Intravenous Given 05/02/22 1146)    ED Course/ Medical Decision Making/ A&P  Medical Decision Making Amount and/or Complexity of Data Reviewed Labs: ordered. Radiology: ordered.  Risk Prescription drug management.   57 year old female presents to the ED for evaluation.  Please see HPI for further details.  On examination the patient is afebrile and nontachycardic.  Patient lung sounds have wheezing throughout, she is not hypoxic on room air.  Abdomen is soft and compressible throughout.  Normal neurological examination.  Posterior oropharynx is not erythematous, no exudate.  Will collect CBC, BMP, VBG, viral panel, chest x-ray.  Will treat initially with Solu-Medrol 125, DuoNeb.  Patient CBC unremarkable without leukocytosis or anemia.  BMP unremarkable.  Viral panel negative for all.  VBG unremarkable.  Chest x-ray unremarkable.  Patient reports after receiving additional duo nebulizer, she feels much better.  Patient wheezing is subsided.  Patient ambulated and shown to maintain oxygen saturation 99%.  Suspect patient had COPD exacerbation secondary to continuing to smoke.  Patient will be sent home on antibiotics, steroid taper.  Patient advised to follow-up with pulmonology.  Patient voiced understanding with instructions.  Patient given return precautions and she voiced understanding.  Patient had all of her questions answered to her satisfaction.  Patient stable for discharge.   Final Clinical  Impression(s) / ED Diagnoses Final diagnoses:  COPD exacerbation ([place])    Rx / DC Orders ED Discharge Orders          Ordered    methylPREDNISolone (MEDROL DOSEPAK) 4 MG TBPK tablet        05/02/22 1205    azithromycin (ZITHROMAX Z-PAK) 250 MG tablet  Daily        05/02/22 1205              Azucena Cecil, [place] 05/02/22 1206    Lajean Saver, MD 05/05/22 1512

## 2022-05-02 NOTE — ED Triage Notes (Signed)
Pt c/o two days of worsening SOB. Pt has hx of COPD. Pt also c/o HA, cough, congestion, sore throat. Pt endorses exposure to Covid and RSV recently through work.

## 2022-06-26 IMAGING — DX DG CHEST 1V PORT
1 series · 1 of 1 positions shown · non-contrast
Comparison: None.

CLINICAL DATA: Cough and shortness of breath.  Chest congestion.

EXAM:
PORTABLE CHEST 1 VIEW

[chest ap]
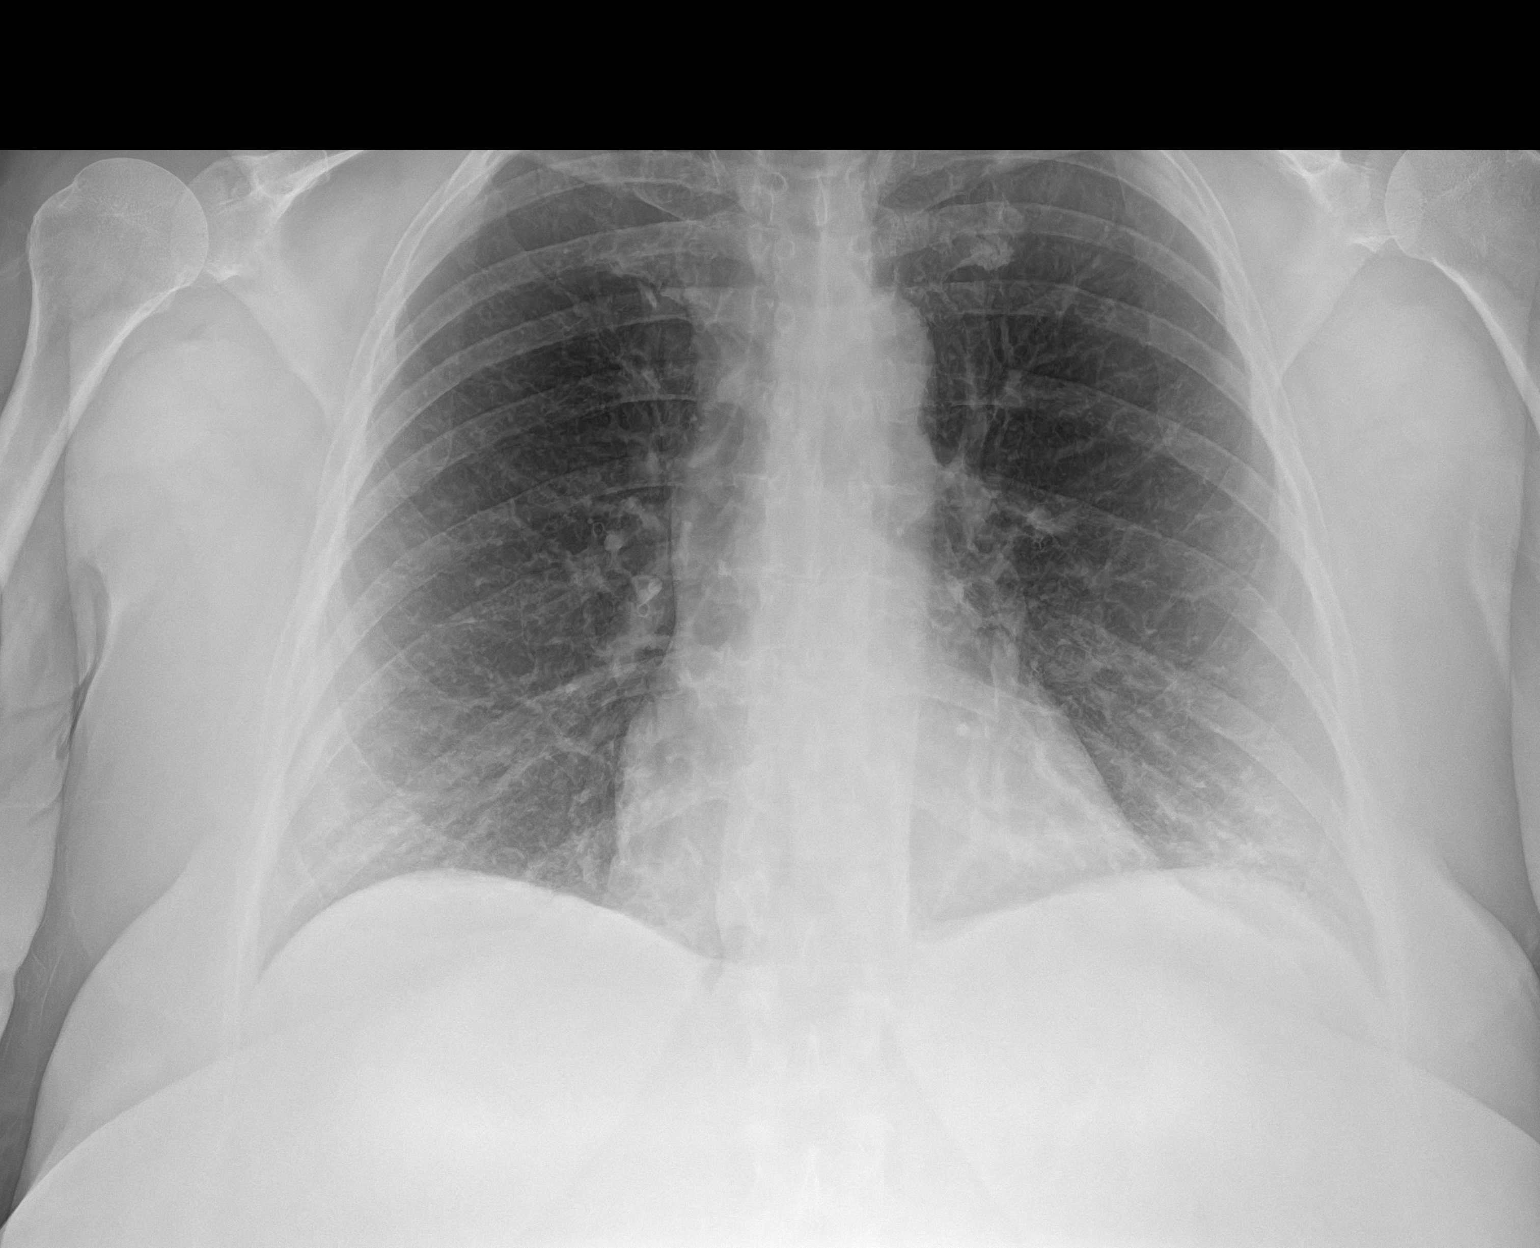

[1 of 1 positions shown; findings below may reference images not displayed]

FINDINGS: The cardiomediastinal contours are normal. Subsegmental opacities at
the left lung base. Pulmonary vasculature is normal. No pleural
effusion or pneumothorax. No acute osseous abnormalities are seen.
IMPRESSION: Subsegmental opacities at the left lung base, favor atelectasis,
although the possibility of pneumonia in the setting of cough is not
entirely excluded.

## 2022-11-01 ENCOUNTER — Emergency Department (HOSPITAL_COMMUNITY)
Admission: EM | Admit: 2022-11-01 | Discharge: 2022-11-01 | Disposition: A | Discharge status: Home / Self Care | Payer: Medicaid Other | Attending: Emergency Medicine | Admitting: Emergency Medicine

## 2022-11-01 ENCOUNTER — Ambulatory Visit (HOSPITAL_COMMUNITY)
Admission: RE | Admit: 2022-11-01 | Discharge: 2022-11-01 | Disposition: A | Discharge status: Home / Self Care | Payer: Medicaid Other | Source: Ambulatory Visit | Attending: Acute Care | Admitting: Acute Care

## 2022-11-01 ENCOUNTER — Encounter (HOSPITAL_COMMUNITY): Payer: Self-pay | Admitting: *Deleted

## 2022-11-01 ENCOUNTER — Other Ambulatory Visit: Payer: Self-pay

## 2022-11-01 ENCOUNTER — Emergency Department (HOSPITAL_COMMUNITY): Payer: Medicaid Other

## 2022-11-01 DIAGNOSIS — I2510 Atherosclerotic heart disease of native coronary artery without angina pectoris: Secondary | ICD-10-CM | POA: Diagnosis not present

## 2022-11-01 DIAGNOSIS — I700 Atherosclerosis of aorta: Secondary | ICD-10-CM | POA: Insufficient documentation

## 2022-11-01 DIAGNOSIS — M545 Low back pain, unspecified: Secondary | ICD-10-CM | POA: Diagnosis present

## 2022-11-01 DIAGNOSIS — F17210 Nicotine dependence, cigarettes, uncomplicated: Secondary | ICD-10-CM | POA: Insufficient documentation

## 2022-11-01 DIAGNOSIS — Z122 Encounter for screening for malignant neoplasm of respiratory organs: Secondary | ICD-10-CM | POA: Diagnosis present

## 2022-11-01 DIAGNOSIS — R109 Unspecified abdominal pain: Secondary | ICD-10-CM | POA: Diagnosis not present

## 2022-11-01 DIAGNOSIS — Z87891 Personal history of nicotine dependence: Secondary | ICD-10-CM | POA: Diagnosis present

## 2022-11-01 LAB — CBC
HCT: 49.7 % — ABNORMAL HIGH (ref 36.0–46.0)
Hemoglobin: 16.7 g/dL — ABNORMAL HIGH (ref 12.0–15.0)
MCH: 30.9 pg (ref 26.0–34.0)
MCHC: 33.6 g/dL (ref 30.0–36.0)
MCV: 92 fL (ref 80.0–100.0)
Platelets: 262 10*3/uL (ref 150–400)
RBC: 5.4 MIL/uL — ABNORMAL HIGH (ref 3.87–5.11)
RDW: 13.6 % (ref 11.5–15.5)
WBC: 10.6 10*3/uL — ABNORMAL HIGH (ref 4.0–10.5)
nRBC: 0 % (ref 0.0–0.2)

## 2022-11-01 LAB — URINALYSIS, ROUTINE W REFLEX MICROSCOPIC
Bilirubin Urine: NEGATIVE
Glucose, UA: NEGATIVE mg/dL
Hgb urine dipstick: NEGATIVE
Ketones, ur: NEGATIVE mg/dL
Leukocytes,Ua: NEGATIVE
Nitrite: NEGATIVE
Protein, ur: NEGATIVE mg/dL
Specific Gravity, Urine: 1.016 (ref 1.005–1.030)
pH: 7 (ref 5.0–8.0)

## 2022-11-01 LAB — COMPREHENSIVE METABOLIC PANEL
ALT: 27 U/L (ref 0–44)
AST: 23 U/L (ref 15–41)
Albumin: 4.3 g/dL (ref 3.5–5.0)
Alkaline Phosphatase: 81 U/L (ref 38–126)
Anion gap: 9 (ref 5–15)
BUN: 13 mg/dL (ref 6–20)
CO2: 23 mmol/L (ref 22–32)
Calcium: 9.4 mg/dL (ref 8.9–10.3)
Chloride: 104 mmol/L (ref 98–111)
Creatinine, Ser: 0.78 mg/dL (ref 0.44–1.00)
GFR, Estimated: >60 mL/min (ref >60)
Glucose, Bld: 124 mg/dL — ABNORMAL HIGH (ref 70–99)
Potassium: 4 mmol/L (ref 3.5–5.1)
Sodium: 136 mmol/L (ref 135–145)
Total Bilirubin: 0.8 mg/dL (ref 0.3–1.2)
Total Protein: 7.2 g/dL (ref 6.5–8.1)

## 2022-11-01 MED ORDER — METHYLPREDNISOLONE 4 MG PO TBPK: ORAL_TABLET | ORAL | 0 refills | Status: AC

## 2022-11-01 MED ORDER — KETOROLAC TROMETHAMINE 30 MG/ML IJ SOLN
30.0000 mg | Freq: Once | INTRAMUSCULAR | Status: AC
  Administered 2022-11-01: 30 mg via INTRAVENOUS
  Filled 2022-11-01: qty 1

## 2022-11-01 NOTE — Discharge Instructions (Addendum)
Take the Medrol Dosepak as instructed.  You can take Tylenol and ibuprofen.  Your CT scan did not show a kidney stone in your ureter.  Your symptoms should improve over the next few weeks.

## 2022-11-01 NOTE — ED Provider Notes (Signed)
[place] EMERGENCY DEPARTMENT AT [facility] Provider Note   CSN: [id number] Arrival date & time: 11/01/22  1356     History  Chief Complaint  Patient presents with   Abdominal Pain    Mikayla Williams is a 57 y.o. female.  This is a 57 year old female who is here today for right sided low back pain with radiation into the groin.  Patient has history of kidney stones.  Patient says that she hurt her back weeding 2 weeks ago, but it has not gotten better.  No fever, no chills.   Abdominal Pain      Home Medications Prior to Admission medications   Medication Sig Start Date End Date Taking? Authorizing Provider  methylPREDNISolone (MEDROL DOSEPAK) 4 MG TBPK tablet Take as instructed by packaging 11/01/22  Yes Anders Simmonds T, DO  albuterol (VENTOLIN HFA) 108 (90 Base) MCG/ACT inhaler Inhale 2 puffs into the lungs every 6 (six) hours as needed.    [provider]  atorvastatin (LIPITOR) 40 MG tablet Take 40 mg by mouth daily. 02/24/21   [provider]  benzonatate (TESSALON) 100 MG capsule Take by mouth 3 (three) times daily as needed for cough.    [provider]  famotidine (PEPCID) 40 MG tablet Take 40 mg by mouth daily. 02/24/21   [provider]  fenofibrate (TRICOR) 145 MG tablet Take 145 mg by mouth daily. 02/24/21   [provider]  Fezolinetant (VEOZAH) 45 MG TABS Take 1 tablet by mouth at bedtime. 03/17/22   Lazaro Arms, MD  FLUoxetine (PROZAC) 40 MG capsule Take 40 mg by mouth daily. 05/19/21   [provider]  furosemide (LASIX) 20 MG tablet Take 1 tablet by mouth daily.    [provider]  gabapentin (NEURONTIN) 400 MG capsule Take 400 mg by mouth 3 (three) times daily. 05/19/21   [provider]  glyBURIDE (DIABETA) 5 MG tablet Take by mouth daily. 05/19/21   [provider]  hydrOXYzine (ATARAX) 25 MG tablet Take 25 mg by mouth 4 (four) times daily. 02/24/21   [provider]  levothyroxine (SYNTHROID) 137 MCG tablet Take 137 mcg by mouth daily. 06/02/21   [provider]  meloxicam (MOBIC) 15 MG tablet Take 15 mg by mouth daily. 06/02/21   [provider]  Tiotropium Bromide-Olodaterol (STIOLTO RESPIMAT) 2.5-2.5 MCG/ACT AERS 2 puffs 1st thing in am 07/02/21   Nyoka Cowden, MD      Allergies    Codeine    Review of Systems   Review of Systems  Gastrointestinal:  Positive for abdominal pain.    Physical Exam Updated Vital Signs BP 136/85 (BP Location: Right Arm)   Pulse 64   Temp 98 F (36.7 C) (Oral)   Resp 20   Ht 5\' 3"  (1.6 m)   Wt 88.6 kg   SpO2 95%   BMI 34.61 kg/m  Physical Exam Abdominal:     Tenderness: There is right CVA tenderness. There is no guarding.  Neurological:     Mental Status: She is alert.     Comments: Negative straight leg test bilaterally.  Some minor pain with knee flexion and adduction     ED Results / Procedures / Treatments   Labs (all labs ordered are listed, but only abnormal results are displayed) Labs Reviewed  COMPREHENSIVE METABOLIC PANEL - Abnormal; Notable for the following components:      Result Value   Glucose, Bld 124 (*)  All other components within normal limits  CBC - Abnormal; Notable for the following components:   WBC 10.6 (*)    RBC 5.40 (*)    Hemoglobin 16.7 (*)    HCT 49.7 (*)    All other components within normal limits  URINALYSIS, ROUTINE W REFLEX MICROSCOPIC - Abnormal; Notable for the following components:   APPearance HAZY (*)    All other components within normal limits    EKG None  Radiology CT ABDOMEN PELVIS WO CONTRAST  Result Date: 11/01/2022 CLINICAL DATA:  Right flank pain for 2 weeks.  Nephrolithiasis. EXAM: CT ABDOMEN AND PELVIS WITHOUT CONTRAST TECHNIQUE: Multidetector CT imaging of the abdomen and pelvis was performed following the standard protocol without IV contrast. RADIATION DOSE REDUCTION: This exam was performed according to  the departmental dose-optimization program which includes automated exposure control, adjustment of the mA and/or kV according to patient size and/or use of iterative reconstruction technique. COMPARISON:  02/14/2022 FINDINGS: Lower chest: No acute findings. Hepatobiliary:  No mass visualized on this unenhanced exam. Pancreas: No mass or inflammatory process visualized on this unenhanced exam. Spleen:  Within normal limits in size. Adrenals/Urinary tract: 1 mm calculus noted in lower pole of left kidney. No evidence of ureteral calculi or hydronephrosis. Unremarkable unopacified urinary bladder. Stomach/Bowel: No evidence of obstruction, inflammatory process, or abnormal fluid collections. Normal appendix visualized. Diverticulosis is seen mainly involving the descending and sigmoid colon, however there is no evidence of diverticulitis. Vascular/Lymphatic: No pathologically enlarged lymph nodes identified. No evidence of abdominal aortic aneurysm. Reproductive:  No mass or other significant abnormality. Other:  None. Musculoskeletal:  No suspicious bone lesions identified. IMPRESSION: Tiny left renal calculus. No evidence of ureteral calculi, hydronephrosis, or other acute findings. Colonic diverticulosis, without radiographic evidence of diverticulitis. Electronically Signed   By: Danae Orleans M.D.   On: 11/01/2022 18:09    Procedures Procedures    Medications Ordered in ED Medications  ketorolac (TORADOL) 30 MG/ML injection 30 mg (30 mg Intravenous Given 11/01/22 1622)    ED Course/ Medical Decision Making/ A&P                                 Medical Decision Making This is a 57 year old female here today with right sided flank pain with radiation into the groin.  Differential diagnoses include nephrolithiasis, less likely pyelonephritis, musculoskeletal pain.  Plan- will obtain imaging the patient's abdomen pelvis.  Urinalysis ordered.  If negative, believe the patient is likely experiencing  musculoskeletal back pain.  Will symptomatically treat.  No neurological deficits.  Amount and/or Complexity of Data Reviewed Labs: ordered. Radiology: ordered.  Risk Prescription drug management.           Final Clinical Impression(s) / ED Diagnoses Final diagnoses:  Acute low back pain, unspecified back pain laterality, unspecified whether sciatica present    Rx / DC Orders ED Discharge Orders          Ordered    methylPREDNISolone (MEDROL DOSEPAK) 4 MG TBPK tablet        11/01/22 1829              Anders Simmonds T, DO 11/01/22 2318

## 2022-11-01 NOTE — ED Triage Notes (Signed)
Pt with left side abd pain that radiates to left back. Started 2 weeks ago, past few days has been worse. Hx of kidney stones. Pain with bending. Denies any blood in urine or burning with urination.

## 2022-11-01 NOTE — ED Notes (Addendum)
C/o R flank pain, and some intermittent nausea. Denies sob, VD, fever, syncope, bleeding, urinary or vaginal sx. Eating now. Last BM yesterday. To CT now.

## 2022-11-09 ENCOUNTER — Other Ambulatory Visit: Payer: Self-pay | Admitting: Acute Care

## 2022-11-09 DIAGNOSIS — Z122 Encounter for screening for malignant neoplasm of respiratory organs: Secondary | ICD-10-CM

## 2022-11-09 DIAGNOSIS — Z87891 Personal history of nicotine dependence: Secondary | ICD-10-CM

## 2022-11-09 DIAGNOSIS — F17210 Nicotine dependence, cigarettes, uncomplicated: Secondary | ICD-10-CM

## 2023-11-09 ENCOUNTER — Telehealth: Payer: Self-pay

## 2023-11-09 NOTE — Telephone Encounter (Signed)
 LVM to call office and schedule annual Lung CT.
